# Patient Record
Sex: Female | Born: 1935 | Race: White | Hispanic: No | Marital: Married | Smoking: Never smoker
Health system: Southern US, Community
[De-identification: ages and names within clinical notes are randomized; demographics above are authoritative.]

## PROBLEM LIST (undated history)

## (undated) DIAGNOSIS — E119 Type 2 diabetes mellitus without complications: Secondary | ICD-10-CM

## (undated) DIAGNOSIS — I10 Essential (primary) hypertension: Secondary | ICD-10-CM

## (undated) DIAGNOSIS — E079 Disorder of thyroid, unspecified: Secondary | ICD-10-CM

## (undated) HISTORY — PX: EYE SURGERY: SHX253

## (undated) HISTORY — DX: Disorder of thyroid, unspecified: E07.9

## (undated) HISTORY — DX: Essential (primary) hypertension: I10

## (undated) HISTORY — DX: Type 2 diabetes mellitus without complications: E11.9

---

## 2003-04-24 ENCOUNTER — Other Ambulatory Visit: Admission: RE | Admit: 2003-04-24 | Discharge: 2003-04-24 | Payer: Self-pay | Admitting: Dermatology

## 2008-10-05 ENCOUNTER — Encounter: Payer: Self-pay | Admitting: Endocrinology

## 2008-10-06 ENCOUNTER — Encounter: Payer: Self-pay | Admitting: Endocrinology

## 2008-10-10 ENCOUNTER — Ambulatory Visit: Payer: Self-pay | Admitting: Endocrinology

## 2008-10-10 DIAGNOSIS — I10 Essential (primary) hypertension: Secondary | ICD-10-CM | POA: Insufficient documentation

## 2008-10-10 DIAGNOSIS — F3289 Other specified depressive episodes: Secondary | ICD-10-CM | POA: Insufficient documentation

## 2008-10-10 DIAGNOSIS — E119 Type 2 diabetes mellitus without complications: Secondary | ICD-10-CM

## 2008-10-10 DIAGNOSIS — F329 Major depressive disorder, single episode, unspecified: Secondary | ICD-10-CM

## 2008-10-10 DIAGNOSIS — E0590 Thyrotoxicosis, unspecified without thyrotoxic crisis or storm: Secondary | ICD-10-CM | POA: Insufficient documentation

## 2008-10-12 ENCOUNTER — Ambulatory Visit (HOSPITAL_COMMUNITY): Admission: RE | Admit: 2008-10-12 | Discharge: 2008-10-12 | Payer: Self-pay | Admitting: Family Medicine

## 2008-10-24 ENCOUNTER — Encounter: Payer: Self-pay | Admitting: Endocrinology

## 2008-10-27 ENCOUNTER — Telehealth (INDEPENDENT_AMBULATORY_CARE_PROVIDER_SITE_OTHER): Payer: Self-pay | Admitting: *Deleted

## 2008-10-30 ENCOUNTER — Encounter: Payer: Self-pay | Admitting: Internal Medicine

## 2008-11-07 ENCOUNTER — Telehealth (INDEPENDENT_AMBULATORY_CARE_PROVIDER_SITE_OTHER): Payer: Self-pay | Admitting: *Deleted

## 2008-11-24 ENCOUNTER — Encounter: Payer: Self-pay | Admitting: Endocrinology

## 2008-12-01 ENCOUNTER — Ambulatory Visit: Payer: Self-pay | Admitting: Endocrinology

## 2008-12-22 ENCOUNTER — Ambulatory Visit: Payer: Self-pay | Admitting: Endocrinology

## 2008-12-22 LAB — CONVERTED CEMR LAB
Basophils Relative: 1 % (ref 0.0–3.0)
Eosinophils Relative: 0.6 % (ref 0.0–5.0)
Free T4: 3.5 ng/dL — ABNORMAL HIGH (ref 0.6–1.6)
HCT: 27.4 % — ABNORMAL LOW (ref 36.0–46.0)
Monocytes Relative: 4.5 % (ref 3.0–12.0)
Neutro Abs: 4.9 10*3/uL (ref 1.4–7.7)
RDW: 12.8 % (ref 11.5–14.6)
WBC: 8.3 10*3/uL (ref 4.5–10.5)

## 2009-01-02 ENCOUNTER — Encounter: Payer: Self-pay | Admitting: Endocrinology

## 2009-01-26 ENCOUNTER — Ambulatory Visit: Payer: Self-pay | Admitting: Endocrinology

## 2009-01-26 LAB — CONVERTED CEMR LAB
Free T4: 1.2 ng/dL (ref 0.6–1.6)
TSH: 0.03 microintl units/mL — ABNORMAL LOW (ref 0.35–5.50)

## 2009-01-29 ENCOUNTER — Telehealth (INDEPENDENT_AMBULATORY_CARE_PROVIDER_SITE_OTHER): Payer: Self-pay | Admitting: *Deleted

## 2009-02-27 ENCOUNTER — Encounter: Payer: Self-pay | Admitting: Endocrinology

## 2009-03-05 ENCOUNTER — Encounter: Payer: Self-pay | Admitting: Endocrinology

## 2009-04-05 ENCOUNTER — Ambulatory Visit: Payer: Self-pay | Admitting: Endocrinology

## 2009-05-18 ENCOUNTER — Ambulatory Visit: Payer: Self-pay | Admitting: Endocrinology

## 2009-05-18 DIAGNOSIS — E042 Nontoxic multinodular goiter: Secondary | ICD-10-CM

## 2009-05-18 LAB — CONVERTED CEMR LAB: TSH: 0.78 microintl units/mL (ref 0.35–5.50)

## 2009-05-30 ENCOUNTER — Encounter: Payer: Self-pay | Admitting: Endocrinology

## 2009-06-06 ENCOUNTER — Encounter: Payer: Self-pay | Admitting: Endocrinology

## 2009-06-25 ENCOUNTER — Encounter: Payer: Self-pay | Admitting: Endocrinology

## 2009-06-28 ENCOUNTER — Encounter: Payer: Self-pay | Admitting: Endocrinology

## 2009-06-29 ENCOUNTER — Telehealth: Payer: Self-pay | Admitting: Endocrinology

## 2009-07-03 ENCOUNTER — Encounter: Payer: Self-pay | Admitting: Endocrinology

## 2009-07-09 ENCOUNTER — Telehealth: Payer: Self-pay | Admitting: Endocrinology

## 2009-07-17 ENCOUNTER — Telehealth: Payer: Self-pay | Admitting: Endocrinology

## 2009-07-23 ENCOUNTER — Telehealth: Payer: Self-pay | Admitting: Endocrinology

## 2009-08-17 ENCOUNTER — Ambulatory Visit: Payer: Self-pay | Admitting: Endocrinology

## 2009-08-17 LAB — CONVERTED CEMR LAB: TSH: 8.79 microintl units/mL — ABNORMAL HIGH (ref 0.35–5.50)

## 2009-09-25 ENCOUNTER — Encounter: Payer: Self-pay | Admitting: Endocrinology

## 2010-07-24 IMAGING — CT NM PET TUM IMG INITIAL (PI) SKULL BASE T - THIGH
6 series · 25 of 25 positions shown · IV contrast (350 OM)
Comparison: none

CLINICAL DATA: Initial treatment strategy for pulmonary nodule.

NUCLEAR MEDICINE PET CT INITIAL (PI) SKULL BASE TO THIGH
TECHNIQUE: 18.4 mCi F-18 FDG was injected intravenously via the
right antecubital fossa.  Full-ring PET imaging was performed from
the skull base through the mid-thighs 56  minutes after injection.
CT data was obtained and used for attenuation correction and
anatomic localization only.  (This was not acquired as a diagnostic
CT examination.)
Fasting Blood Glucose:   157

[Series 1: pet ac · axial · 3.3mm · 4.69mm/px · z∈[-870,+0]mm · 5 of 267 slices shown]
[im 1/267]
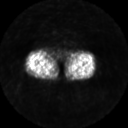
[im 67/267]
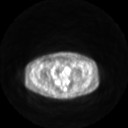
[im 134/267]
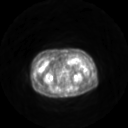
[im 200/267]
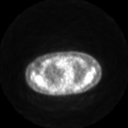
[im 267/267]
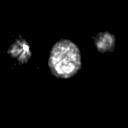

[Series 2: ct images · axial · 3.8mm · 0.98mm/px · z∈[-870,+0]mm · 6 of 267 slices shown]
[im 1/267]
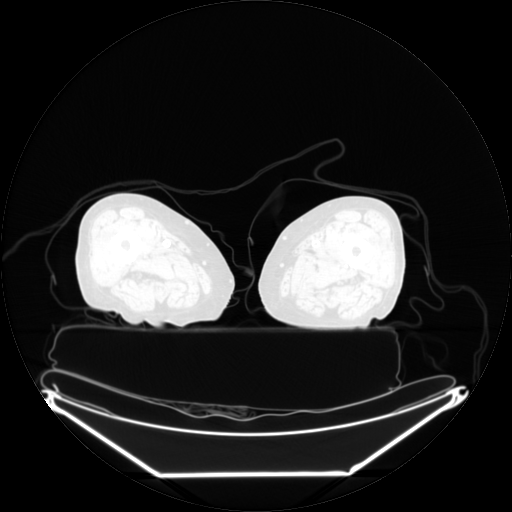
[im 54/267]
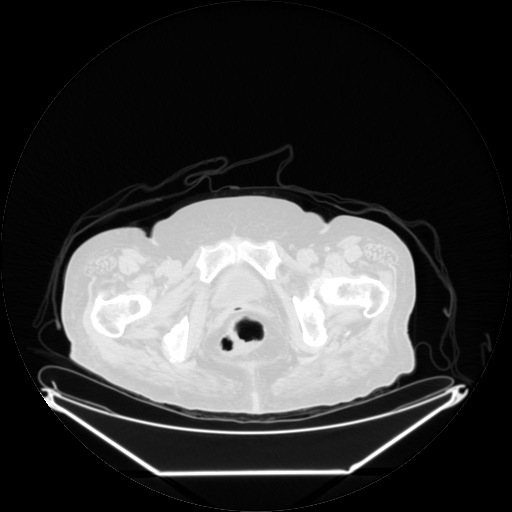
[im 107/267]
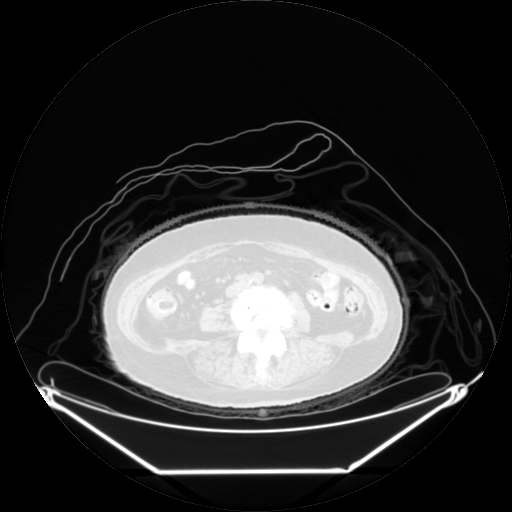
[im 160/267]
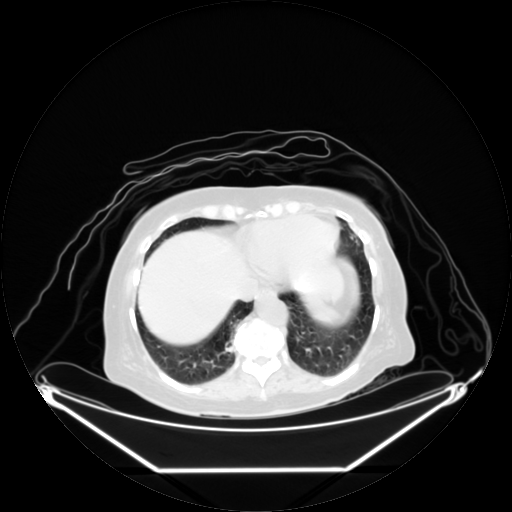
[im 213/267]
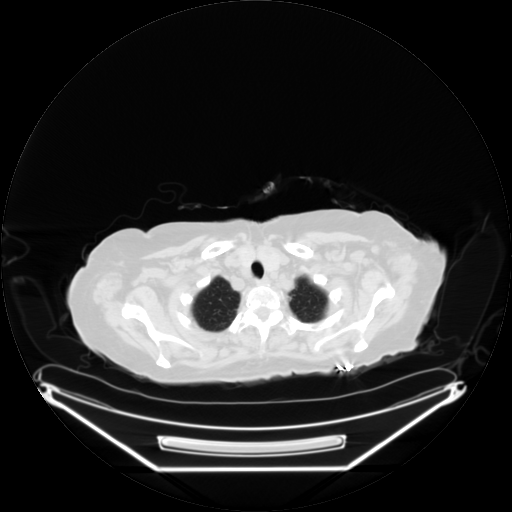
[im 267/267]
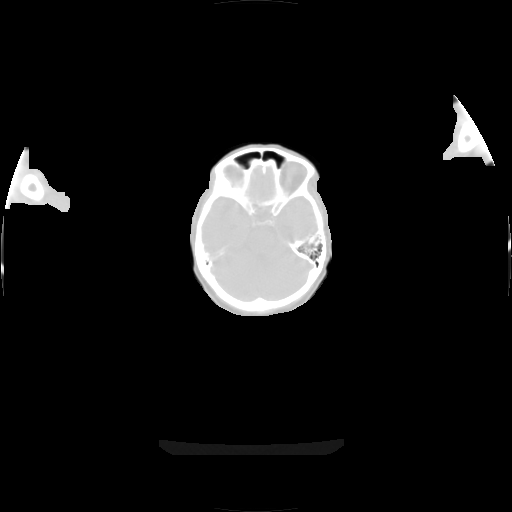

[Series 2: pet nac · axial · 3.3mm · 4.69mm/px · z∈[-870,+0]mm · 6 of 267 slices shown]
[im 1/267]
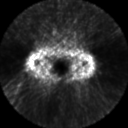
[im 54/267]
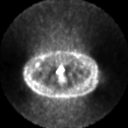
[im 107/267]
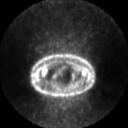
[im 160/267]
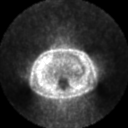
[im 213/267]
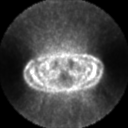
[im 267/267]
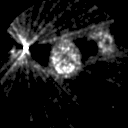

[Series 123: mip · coronal · 3.3mm · 4.69mm/px · 1 of 30 slices shown]
[im 1/30]
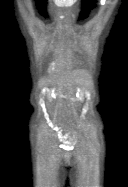

[Series 151: reformatted · axial · 3.3mm · 3.91mm/px · z∈[-870,+0]mm · 6 of 265 slices shown (1 of 2)]
[im 1/265]
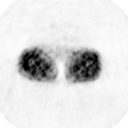
[im 53/265]
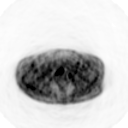
[im 106/265]
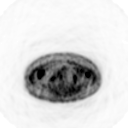
[im 159/265]
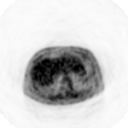
[im 212/265]
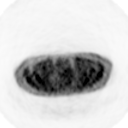
[im 265/265]
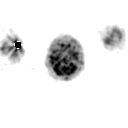

[Series 153: reformatted · coronal · 4.7mm · 6.98mm/px · 1 of 60 slices shown (2 of 2)]
[im 1/60]
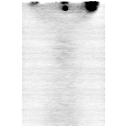

[25 of 25 positions shown; findings below may reference images not displayed]

FINDINGS: There are no hypermetabolic lymph nodes within the soft
tissues of the neck.

There are no hypermetabolic supraclavicular or axillary lymph
nodes.

No hypermetabolic mediastinal or hilar lymph nodes are identified.

There is no pericardial or pleural effusion.

The small subpleural density within the medial aspect of the right
lower lobe is again identified.  This is not significantly changed
from previous exam.  There is no malignant range FDG uptake
associated with this structure.

There are no additional suspicious pulmonary nodules or masses
identified.

No abnormal FDG uptake is identified. The liver is negative.

Spleen is negative.

There is no abnormal uptake within the adrenal glands.

No abnormal uptake within the pancreas.

No hypermetabolic retroperitoneal or small bowel mesenteric lymph
nodes.

There are no hypermetabolic pelvic or inguinal lymph nodes.

There is no hypermetabolic pelvic mass.

Review of the visualized osseous structures shows no specific
evidence for hypermetabolic bone metastases.
IMPRESSION: 1.  There is no abnormal FDG uptake associated with the subpleural
density in the right lower lobe.  This likely represents a benign
finding.  This has been stable for 6 months.  I would recommend a
follow-up noncontrast CT of the chest in six months from today to
ensure continued stability.

## 2010-08-11 ENCOUNTER — Encounter: Payer: Self-pay | Admitting: Family Medicine

## 2010-08-11 ENCOUNTER — Encounter: Payer: Self-pay | Admitting: Endocrinology

## 2010-08-20 NOTE — Letter (Signed)
Summary: [facility]  [facility]   Imported By: Sherian Rein 06/27/2009 08:11:38  _____________________________________________________________________  External Attachment:    Type:   Image     Comment:   External Document

## 2010-08-20 NOTE — Progress Notes (Signed)
Summary: I-131 therapy  Phone Note Call from Patient   Caller: Daughter [place] Citizen [phone] Summary of Call: pt's daughter called requesting a call from MD. pt's daughter states that pt is in a skilled nursing facility and she does not think pt will be allowed to go back to facility after I-131. Pt's daughter would like to discuss options with MD. please advise. Initial call taken by: Margaret Pyle, CMA,  July 23, 2009 9:00 AM  Follow-up for Phone Call        i called back 07/23/09.  as it is uncertain how long pt will be in rehab facility, i would advise tapazole 10 mg once daily.  however, for safety reasons, i cannot order this because i do not have access to her med list and she can't travel here for f/u.  i will fax this message to dr sasser, but i don't know if he can prescribe this either. Follow-up by: Minus Breeding MD,  July 23, 2009 12:36 PM

## 2010-08-20 NOTE — Assessment & Plan Note (Signed)
Summary: 3 MOROV /NWS #   Vital Signs:  Patient profile:   77 year old female Height:      64 inches (162.56 cm) Weight:      157.12 pounds (71.42 kg) O2 Sat:      97 % on Room air Temp:     97.7 degrees F (36.50 degrees C) oral Pulse rate:   85 / minute BP sitting:   152 / 98  (left arm) Cuff size:   regular  Vitals Entered By: Orlan Leavens (August 17, 2009 9:56 AM)  O2 Flow:  Room air CC: 3 month follow-up Is Patient Diabetic? Yes Did you bring your meter with you today? No Pain Assessment Patient in pain? no        Referring Provider:  Dr. Neita Carp Primary Provider:  Dr. Fara Chute  CC:  3 month follow-up.  History of Present Illness: pt is now out of rehab facility.  she is on tapazole, and feels better recently.    Current Medications (verified): 1)  Sertraline Hcl 50 Mg Tabs (Sertraline Hcl) .... Take 1 By Mouth Qd 2)  Metformin Hcl 500 Mg Xr24h-Tab (Metformin Hcl) .... Take 2 By Mouth Qd 3)  Lomotil 2.5-0.025 Mg Tabs (Diphenoxylate-Atropine) .Marland Kitchen.. 1 Tab After A Loose Stool As Needed 4)  Omeprazole 20 Mg Cpdr (Omeprazole) .Marland Kitchen.. 1 Daily 5)  Triamcinolone Acetonide 0.1 % Crea (Triamcinolone Acetonide) .... Three Times A Day As Needed Itching 6)  Toprol Xl 25 Mg Xr24h-Tab (Metoprolol Succinate) .Marland Kitchen.. 1 Qd 7)  Tapazole 10 Mg Tabs (Methimazole) .... Take 1 By Mouth Once Daily 8)  Ativan 0.5 Mg Tabs (Lorazepam) .... Take 1 Q 8 Hours As Needed 9)  Oscal 500/200 D-3 500-200 Mg-Unit Tabs (Calcium-Vitamin D) .... Take 2 By Mouth Qd 10)  Multivitamins  Tabs (Multiple Vitamin) .... Once Daily  Allergies (verified): 1)  ! * All Anti-Inflamm  Past History:  Past Medical History: Last updated: 12/22/2008 DEPRESSION (ICD-311) HYPERTENSION (ICD-401.9) DM (ICD-250.00) HYPERTHYROIDISM (ICD-242.90)  Review of Systems  The patient denies fever.    Physical Exam  General:  elderly, frail, no distress. in wheelchair Neck:  the is a small multinodular goiter, right > left    Additional Exam:  FastTSH              [H]  8.79 uIU/mL                 0.35-5.50 Free T4              [L]  0.3 ng/dL                   5.4-0.9    Impression & Recommendations:  Problem # 1:  HYPERTHYROIDISM (ICD-242.90) much better  Medications Added to Medication List This Visit: 1)  Metformin Hcl 500 Mg Xr24h-tab (Metformin hcl) .... Take 2 by mouth qd 2)  Tapazole 10 Mg Tabs (Methimazole) .... Take 1 by mouth once daily 3)  Ativan 0.5 Mg Tabs (Lorazepam) .... Take 1 q 8 hours as needed 4)  Oscal 500/200 D-3 500-200 Mg-unit Tabs (Calcium-vitamin d) .... Take 2 by mouth qd 5)  Multivitamins Tabs (Multiple vitamin) .... Once daily 6)  Methimazole 5 Mg Tabs (Methimazole) .Marland Kitchen.. 1 tab three times weekly (m,w,f)  Other Orders: TLB-TSH (Thyroid Stimulating Hormone) (84443-TSH) TLB-T4 (Thyrox), Free [phone]) Est. Patient Level III (95621)  Patient Instructions: 1)  tests are being ordered for you today.  a few days after the test(s), please call [phone]  to hear your test results. 2)  pending the test results, please continue the same medications for now. 3)  (update: i left message on phone-tree:  reduce methimazole to 5 mg three times weekly (m,w,f).  please see dr Neita Carp in 6 weeks, and me in 6 months (as it is difficult for pt to travel here) Prescriptions: METHIMAZOLE 5 MG TABS (METHIMAZOLE) 1 tab three times weekly (m,w,f)  #12 x 5   Entered and Authorized by:   Minus Breeding MD   Signed by:   Minus Breeding MD on 08/17/2009   Method used:   Electronically to        [facility] (retail)       [address]       [place], [place]  [address]       Ph: [phone]       Fax: [phone]   RxID:   [id number]    Immunization History:  Influenza Immunization History:    Influenza:  historical (04/20/2009)

## 2010-08-20 NOTE — Miscellaneous (Signed)
Summary: [facility]  Clinical Lists Changes  Observations: Added new observation of HGBA1C: 7.4 % (05/30/2009 15:31)         -  Date:  05/30/2009    HbA1c: 7.4 DONE AT [facility]/ [facility]

## 2013-08-15 ENCOUNTER — Encounter (INDEPENDENT_AMBULATORY_CARE_PROVIDER_SITE_OTHER): Payer: Self-pay | Admitting: Ophthalmology

## 2014-11-29 DIAGNOSIS — F330 Major depressive disorder, recurrent, mild: Secondary | ICD-10-CM | POA: Diagnosis not present

## 2014-11-29 DIAGNOSIS — E782 Mixed hyperlipidemia: Secondary | ICD-10-CM | POA: Diagnosis not present

## 2014-11-29 DIAGNOSIS — E038 Other specified hypothyroidism: Secondary | ICD-10-CM | POA: Diagnosis not present

## 2014-11-29 DIAGNOSIS — Z1389 Encounter for screening for other disorder: Secondary | ICD-10-CM | POA: Diagnosis not present

## 2014-11-29 DIAGNOSIS — I10 Essential (primary) hypertension: Secondary | ICD-10-CM | POA: Diagnosis not present

## 2014-11-29 DIAGNOSIS — E1165 Type 2 diabetes mellitus with hyperglycemia: Secondary | ICD-10-CM | POA: Diagnosis not present

## 2014-11-29 DIAGNOSIS — N183 Chronic kidney disease, stage 3 (moderate): Secondary | ICD-10-CM | POA: Diagnosis not present

## 2015-01-08 DIAGNOSIS — I10 Essential (primary) hypertension: Secondary | ICD-10-CM | POA: Diagnosis not present

## 2015-01-08 DIAGNOSIS — E782 Mixed hyperlipidemia: Secondary | ICD-10-CM | POA: Diagnosis not present

## 2015-01-08 DIAGNOSIS — N183 Chronic kidney disease, stage 3 (moderate): Secondary | ICD-10-CM | POA: Diagnosis not present

## 2015-01-08 DIAGNOSIS — E1165 Type 2 diabetes mellitus with hyperglycemia: Secondary | ICD-10-CM | POA: Diagnosis not present

## 2015-01-17 DIAGNOSIS — F330 Major depressive disorder, recurrent, mild: Secondary | ICD-10-CM | POA: Diagnosis not present

## 2015-01-17 DIAGNOSIS — E1165 Type 2 diabetes mellitus with hyperglycemia: Secondary | ICD-10-CM | POA: Diagnosis not present

## 2015-01-17 DIAGNOSIS — E782 Mixed hyperlipidemia: Secondary | ICD-10-CM | POA: Diagnosis not present

## 2015-01-17 DIAGNOSIS — S161XXA Strain of muscle, fascia and tendon at neck level, initial encounter: Secondary | ICD-10-CM | POA: Diagnosis not present

## 2015-01-17 DIAGNOSIS — E038 Other specified hypothyroidism: Secondary | ICD-10-CM | POA: Diagnosis not present

## 2015-01-17 DIAGNOSIS — N183 Chronic kidney disease, stage 3 (moderate): Secondary | ICD-10-CM | POA: Diagnosis not present

## 2015-01-17 DIAGNOSIS — E1122 Type 2 diabetes mellitus with diabetic chronic kidney disease: Secondary | ICD-10-CM | POA: Diagnosis not present

## 2015-01-17 DIAGNOSIS — I10 Essential (primary) hypertension: Secondary | ICD-10-CM | POA: Diagnosis not present

## 2015-03-12 DIAGNOSIS — W1800XA Striking against unspecified object with subsequent fall, initial encounter: Secondary | ICD-10-CM | POA: Diagnosis not present

## 2015-03-12 DIAGNOSIS — S199XXA Unspecified injury of neck, initial encounter: Secondary | ICD-10-CM | POA: Diagnosis not present

## 2015-03-12 DIAGNOSIS — S0990XA Unspecified injury of head, initial encounter: Secondary | ICD-10-CM | POA: Diagnosis not present

## 2015-03-12 DIAGNOSIS — S0190XA Unspecified open wound of unspecified part of head, initial encounter: Secondary | ICD-10-CM | POA: Diagnosis not present

## 2015-03-12 DIAGNOSIS — F419 Anxiety disorder, unspecified: Secondary | ICD-10-CM | POA: Diagnosis not present

## 2015-03-12 DIAGNOSIS — S0103XA Puncture wound without foreign body of scalp, initial encounter: Secondary | ICD-10-CM | POA: Diagnosis not present

## 2015-03-12 DIAGNOSIS — S0191XA Laceration without foreign body of unspecified part of head, initial encounter: Secondary | ICD-10-CM | POA: Diagnosis not present

## 2015-03-12 DIAGNOSIS — E039 Hypothyroidism, unspecified: Secondary | ICD-10-CM | POA: Diagnosis not present

## 2015-03-12 DIAGNOSIS — I10 Essential (primary) hypertension: Secondary | ICD-10-CM | POA: Diagnosis not present

## 2015-03-12 DIAGNOSIS — S299XXA Unspecified injury of thorax, initial encounter: Secondary | ICD-10-CM | POA: Diagnosis not present

## 2015-03-12 DIAGNOSIS — Z79899 Other long term (current) drug therapy: Secondary | ICD-10-CM | POA: Diagnosis not present

## 2015-03-12 DIAGNOSIS — S2191XA Laceration without foreign body of unspecified part of thorax, initial encounter: Secondary | ICD-10-CM | POA: Diagnosis not present

## 2015-03-20 DIAGNOSIS — R296 Repeated falls: Secondary | ICD-10-CM | POA: Diagnosis not present

## 2015-03-20 DIAGNOSIS — R279 Unspecified lack of coordination: Secondary | ICD-10-CM | POA: Diagnosis not present

## 2015-03-20 DIAGNOSIS — L97521 Non-pressure chronic ulcer of other part of left foot limited to breakdown of skin: Secondary | ICD-10-CM | POA: Diagnosis not present

## 2015-03-20 DIAGNOSIS — I129 Hypertensive chronic kidney disease with stage 1 through stage 4 chronic kidney disease, or unspecified chronic kidney disease: Secondary | ICD-10-CM | POA: Diagnosis not present

## 2015-03-20 DIAGNOSIS — F330 Major depressive disorder, recurrent, mild: Secondary | ICD-10-CM | POA: Diagnosis not present

## 2015-03-20 DIAGNOSIS — N183 Chronic kidney disease, stage 3 (moderate): Secondary | ICD-10-CM | POA: Diagnosis not present

## 2015-03-20 DIAGNOSIS — E11621 Type 2 diabetes mellitus with foot ulcer: Secondary | ICD-10-CM | POA: Diagnosis not present

## 2015-03-20 DIAGNOSIS — S0180XD Unspecified open wound of other part of head, subsequent encounter: Secondary | ICD-10-CM | POA: Diagnosis not present

## 2015-03-20 DIAGNOSIS — E1122 Type 2 diabetes mellitus with diabetic chronic kidney disease: Secondary | ICD-10-CM | POA: Diagnosis not present

## 2015-03-21 DIAGNOSIS — E11621 Type 2 diabetes mellitus with foot ulcer: Secondary | ICD-10-CM | POA: Diagnosis not present

## 2015-03-21 DIAGNOSIS — S0180XD Unspecified open wound of other part of head, subsequent encounter: Secondary | ICD-10-CM | POA: Diagnosis not present

## 2015-03-21 DIAGNOSIS — R279 Unspecified lack of coordination: Secondary | ICD-10-CM | POA: Diagnosis not present

## 2015-03-21 DIAGNOSIS — I129 Hypertensive chronic kidney disease with stage 1 through stage 4 chronic kidney disease, or unspecified chronic kidney disease: Secondary | ICD-10-CM | POA: Diagnosis not present

## 2015-03-21 DIAGNOSIS — F330 Major depressive disorder, recurrent, mild: Secondary | ICD-10-CM | POA: Diagnosis not present

## 2015-03-21 DIAGNOSIS — R296 Repeated falls: Secondary | ICD-10-CM | POA: Diagnosis not present

## 2015-03-21 DIAGNOSIS — L97521 Non-pressure chronic ulcer of other part of left foot limited to breakdown of skin: Secondary | ICD-10-CM | POA: Diagnosis not present

## 2015-03-21 DIAGNOSIS — E1122 Type 2 diabetes mellitus with diabetic chronic kidney disease: Secondary | ICD-10-CM | POA: Diagnosis not present

## 2015-03-21 DIAGNOSIS — N183 Chronic kidney disease, stage 3 (moderate): Secondary | ICD-10-CM | POA: Diagnosis not present

## 2015-03-22 DIAGNOSIS — R296 Repeated falls: Secondary | ICD-10-CM | POA: Diagnosis not present

## 2015-03-22 DIAGNOSIS — I129 Hypertensive chronic kidney disease with stage 1 through stage 4 chronic kidney disease, or unspecified chronic kidney disease: Secondary | ICD-10-CM | POA: Diagnosis not present

## 2015-03-22 DIAGNOSIS — S0180XD Unspecified open wound of other part of head, subsequent encounter: Secondary | ICD-10-CM | POA: Diagnosis not present

## 2015-03-22 DIAGNOSIS — L97521 Non-pressure chronic ulcer of other part of left foot limited to breakdown of skin: Secondary | ICD-10-CM | POA: Diagnosis not present

## 2015-03-22 DIAGNOSIS — R279 Unspecified lack of coordination: Secondary | ICD-10-CM | POA: Diagnosis not present

## 2015-03-22 DIAGNOSIS — E11621 Type 2 diabetes mellitus with foot ulcer: Secondary | ICD-10-CM | POA: Diagnosis not present

## 2015-03-22 DIAGNOSIS — N183 Chronic kidney disease, stage 3 (moderate): Secondary | ICD-10-CM | POA: Diagnosis not present

## 2015-03-22 DIAGNOSIS — F330 Major depressive disorder, recurrent, mild: Secondary | ICD-10-CM | POA: Diagnosis not present

## 2015-03-22 DIAGNOSIS — E1122 Type 2 diabetes mellitus with diabetic chronic kidney disease: Secondary | ICD-10-CM | POA: Diagnosis not present

## 2015-03-23 DIAGNOSIS — E1122 Type 2 diabetes mellitus with diabetic chronic kidney disease: Secondary | ICD-10-CM | POA: Diagnosis not present

## 2015-03-23 DIAGNOSIS — R296 Repeated falls: Secondary | ICD-10-CM | POA: Diagnosis not present

## 2015-03-23 DIAGNOSIS — F330 Major depressive disorder, recurrent, mild: Secondary | ICD-10-CM | POA: Diagnosis not present

## 2015-03-23 DIAGNOSIS — E11621 Type 2 diabetes mellitus with foot ulcer: Secondary | ICD-10-CM | POA: Diagnosis not present

## 2015-03-23 DIAGNOSIS — S0180XD Unspecified open wound of other part of head, subsequent encounter: Secondary | ICD-10-CM | POA: Diagnosis not present

## 2015-03-23 DIAGNOSIS — N183 Chronic kidney disease, stage 3 (moderate): Secondary | ICD-10-CM | POA: Diagnosis not present

## 2015-03-23 DIAGNOSIS — I129 Hypertensive chronic kidney disease with stage 1 through stage 4 chronic kidney disease, or unspecified chronic kidney disease: Secondary | ICD-10-CM | POA: Diagnosis not present

## 2015-03-23 DIAGNOSIS — R279 Unspecified lack of coordination: Secondary | ICD-10-CM | POA: Diagnosis not present

## 2015-03-23 DIAGNOSIS — L97521 Non-pressure chronic ulcer of other part of left foot limited to breakdown of skin: Secondary | ICD-10-CM | POA: Diagnosis not present

## 2015-03-26 DIAGNOSIS — E11621 Type 2 diabetes mellitus with foot ulcer: Secondary | ICD-10-CM | POA: Diagnosis not present

## 2015-03-26 DIAGNOSIS — N183 Chronic kidney disease, stage 3 (moderate): Secondary | ICD-10-CM | POA: Diagnosis not present

## 2015-03-26 DIAGNOSIS — R279 Unspecified lack of coordination: Secondary | ICD-10-CM | POA: Diagnosis not present

## 2015-03-26 DIAGNOSIS — F330 Major depressive disorder, recurrent, mild: Secondary | ICD-10-CM | POA: Diagnosis not present

## 2015-03-26 DIAGNOSIS — E1122 Type 2 diabetes mellitus with diabetic chronic kidney disease: Secondary | ICD-10-CM | POA: Diagnosis not present

## 2015-03-26 DIAGNOSIS — R296 Repeated falls: Secondary | ICD-10-CM | POA: Diagnosis not present

## 2015-03-26 DIAGNOSIS — I129 Hypertensive chronic kidney disease with stage 1 through stage 4 chronic kidney disease, or unspecified chronic kidney disease: Secondary | ICD-10-CM | POA: Diagnosis not present

## 2015-03-26 DIAGNOSIS — S0180XD Unspecified open wound of other part of head, subsequent encounter: Secondary | ICD-10-CM | POA: Diagnosis not present

## 2015-03-26 DIAGNOSIS — L97521 Non-pressure chronic ulcer of other part of left foot limited to breakdown of skin: Secondary | ICD-10-CM | POA: Diagnosis not present

## 2015-03-28 DIAGNOSIS — E1122 Type 2 diabetes mellitus with diabetic chronic kidney disease: Secondary | ICD-10-CM | POA: Diagnosis not present

## 2015-03-28 DIAGNOSIS — I129 Hypertensive chronic kidney disease with stage 1 through stage 4 chronic kidney disease, or unspecified chronic kidney disease: Secondary | ICD-10-CM | POA: Diagnosis not present

## 2015-03-28 DIAGNOSIS — F330 Major depressive disorder, recurrent, mild: Secondary | ICD-10-CM | POA: Diagnosis not present

## 2015-03-28 DIAGNOSIS — E11621 Type 2 diabetes mellitus with foot ulcer: Secondary | ICD-10-CM | POA: Diagnosis not present

## 2015-03-28 DIAGNOSIS — N183 Chronic kidney disease, stage 3 (moderate): Secondary | ICD-10-CM | POA: Diagnosis not present

## 2015-03-28 DIAGNOSIS — R296 Repeated falls: Secondary | ICD-10-CM | POA: Diagnosis not present

## 2015-03-28 DIAGNOSIS — S0180XD Unspecified open wound of other part of head, subsequent encounter: Secondary | ICD-10-CM | POA: Diagnosis not present

## 2015-03-28 DIAGNOSIS — L97521 Non-pressure chronic ulcer of other part of left foot limited to breakdown of skin: Secondary | ICD-10-CM | POA: Diagnosis not present

## 2015-03-28 DIAGNOSIS — R279 Unspecified lack of coordination: Secondary | ICD-10-CM | POA: Diagnosis not present

## 2015-03-29 DIAGNOSIS — E11621 Type 2 diabetes mellitus with foot ulcer: Secondary | ICD-10-CM | POA: Diagnosis not present

## 2015-03-29 DIAGNOSIS — R296 Repeated falls: Secondary | ICD-10-CM | POA: Diagnosis not present

## 2015-03-29 DIAGNOSIS — N183 Chronic kidney disease, stage 3 (moderate): Secondary | ICD-10-CM | POA: Diagnosis not present

## 2015-03-29 DIAGNOSIS — E1122 Type 2 diabetes mellitus with diabetic chronic kidney disease: Secondary | ICD-10-CM | POA: Diagnosis not present

## 2015-03-29 DIAGNOSIS — L97521 Non-pressure chronic ulcer of other part of left foot limited to breakdown of skin: Secondary | ICD-10-CM | POA: Diagnosis not present

## 2015-03-29 DIAGNOSIS — I129 Hypertensive chronic kidney disease with stage 1 through stage 4 chronic kidney disease, or unspecified chronic kidney disease: Secondary | ICD-10-CM | POA: Diagnosis not present

## 2015-03-29 DIAGNOSIS — S0180XD Unspecified open wound of other part of head, subsequent encounter: Secondary | ICD-10-CM | POA: Diagnosis not present

## 2015-03-29 DIAGNOSIS — F330 Major depressive disorder, recurrent, mild: Secondary | ICD-10-CM | POA: Diagnosis not present

## 2015-03-29 DIAGNOSIS — R279 Unspecified lack of coordination: Secondary | ICD-10-CM | POA: Diagnosis not present

## 2015-03-30 DIAGNOSIS — N183 Chronic kidney disease, stage 3 (moderate): Secondary | ICD-10-CM | POA: Diagnosis not present

## 2015-03-30 DIAGNOSIS — S0180XD Unspecified open wound of other part of head, subsequent encounter: Secondary | ICD-10-CM | POA: Diagnosis not present

## 2015-03-30 DIAGNOSIS — F330 Major depressive disorder, recurrent, mild: Secondary | ICD-10-CM | POA: Diagnosis not present

## 2015-03-30 DIAGNOSIS — E11621 Type 2 diabetes mellitus with foot ulcer: Secondary | ICD-10-CM | POA: Diagnosis not present

## 2015-03-30 DIAGNOSIS — E1122 Type 2 diabetes mellitus with diabetic chronic kidney disease: Secondary | ICD-10-CM | POA: Diagnosis not present

## 2015-03-30 DIAGNOSIS — L97521 Non-pressure chronic ulcer of other part of left foot limited to breakdown of skin: Secondary | ICD-10-CM | POA: Diagnosis not present

## 2015-03-30 DIAGNOSIS — R296 Repeated falls: Secondary | ICD-10-CM | POA: Diagnosis not present

## 2015-03-30 DIAGNOSIS — R279 Unspecified lack of coordination: Secondary | ICD-10-CM | POA: Diagnosis not present

## 2015-03-30 DIAGNOSIS — I129 Hypertensive chronic kidney disease with stage 1 through stage 4 chronic kidney disease, or unspecified chronic kidney disease: Secondary | ICD-10-CM | POA: Diagnosis not present

## 2015-04-02 DIAGNOSIS — N183 Chronic kidney disease, stage 3 (moderate): Secondary | ICD-10-CM | POA: Diagnosis not present

## 2015-04-02 DIAGNOSIS — E11621 Type 2 diabetes mellitus with foot ulcer: Secondary | ICD-10-CM | POA: Diagnosis not present

## 2015-04-02 DIAGNOSIS — R296 Repeated falls: Secondary | ICD-10-CM | POA: Diagnosis not present

## 2015-04-02 DIAGNOSIS — E1122 Type 2 diabetes mellitus with diabetic chronic kidney disease: Secondary | ICD-10-CM | POA: Diagnosis not present

## 2015-04-02 DIAGNOSIS — S0180XD Unspecified open wound of other part of head, subsequent encounter: Secondary | ICD-10-CM | POA: Diagnosis not present

## 2015-04-02 DIAGNOSIS — F330 Major depressive disorder, recurrent, mild: Secondary | ICD-10-CM | POA: Diagnosis not present

## 2015-04-02 DIAGNOSIS — I129 Hypertensive chronic kidney disease with stage 1 through stage 4 chronic kidney disease, or unspecified chronic kidney disease: Secondary | ICD-10-CM | POA: Diagnosis not present

## 2015-04-02 DIAGNOSIS — L97521 Non-pressure chronic ulcer of other part of left foot limited to breakdown of skin: Secondary | ICD-10-CM | POA: Diagnosis not present

## 2015-04-02 DIAGNOSIS — R279 Unspecified lack of coordination: Secondary | ICD-10-CM | POA: Diagnosis not present

## 2015-04-03 DIAGNOSIS — F330 Major depressive disorder, recurrent, mild: Secondary | ICD-10-CM | POA: Diagnosis not present

## 2015-04-03 DIAGNOSIS — E1122 Type 2 diabetes mellitus with diabetic chronic kidney disease: Secondary | ICD-10-CM | POA: Diagnosis not present

## 2015-04-03 DIAGNOSIS — L97521 Non-pressure chronic ulcer of other part of left foot limited to breakdown of skin: Secondary | ICD-10-CM | POA: Diagnosis not present

## 2015-04-03 DIAGNOSIS — N183 Chronic kidney disease, stage 3 (moderate): Secondary | ICD-10-CM | POA: Diagnosis not present

## 2015-04-03 DIAGNOSIS — S0180XD Unspecified open wound of other part of head, subsequent encounter: Secondary | ICD-10-CM | POA: Diagnosis not present

## 2015-04-03 DIAGNOSIS — I129 Hypertensive chronic kidney disease with stage 1 through stage 4 chronic kidney disease, or unspecified chronic kidney disease: Secondary | ICD-10-CM | POA: Diagnosis not present

## 2015-04-03 DIAGNOSIS — R296 Repeated falls: Secondary | ICD-10-CM | POA: Diagnosis not present

## 2015-04-03 DIAGNOSIS — E11621 Type 2 diabetes mellitus with foot ulcer: Secondary | ICD-10-CM | POA: Diagnosis not present

## 2015-04-03 DIAGNOSIS — R279 Unspecified lack of coordination: Secondary | ICD-10-CM | POA: Diagnosis not present

## 2015-04-04 DIAGNOSIS — R296 Repeated falls: Secondary | ICD-10-CM | POA: Diagnosis not present

## 2015-04-04 DIAGNOSIS — N183 Chronic kidney disease, stage 3 (moderate): Secondary | ICD-10-CM | POA: Diagnosis not present

## 2015-04-04 DIAGNOSIS — E1122 Type 2 diabetes mellitus with diabetic chronic kidney disease: Secondary | ICD-10-CM | POA: Diagnosis not present

## 2015-04-04 DIAGNOSIS — E11621 Type 2 diabetes mellitus with foot ulcer: Secondary | ICD-10-CM | POA: Diagnosis not present

## 2015-04-04 DIAGNOSIS — L97521 Non-pressure chronic ulcer of other part of left foot limited to breakdown of skin: Secondary | ICD-10-CM | POA: Diagnosis not present

## 2015-04-04 DIAGNOSIS — F330 Major depressive disorder, recurrent, mild: Secondary | ICD-10-CM | POA: Diagnosis not present

## 2015-04-04 DIAGNOSIS — R279 Unspecified lack of coordination: Secondary | ICD-10-CM | POA: Diagnosis not present

## 2015-04-04 DIAGNOSIS — S0180XD Unspecified open wound of other part of head, subsequent encounter: Secondary | ICD-10-CM | POA: Diagnosis not present

## 2015-04-04 DIAGNOSIS — I129 Hypertensive chronic kidney disease with stage 1 through stage 4 chronic kidney disease, or unspecified chronic kidney disease: Secondary | ICD-10-CM | POA: Diagnosis not present

## 2015-04-05 DIAGNOSIS — E11621 Type 2 diabetes mellitus with foot ulcer: Secondary | ICD-10-CM | POA: Diagnosis not present

## 2015-04-05 DIAGNOSIS — L97521 Non-pressure chronic ulcer of other part of left foot limited to breakdown of skin: Secondary | ICD-10-CM | POA: Diagnosis not present

## 2015-04-05 DIAGNOSIS — N183 Chronic kidney disease, stage 3 (moderate): Secondary | ICD-10-CM | POA: Diagnosis not present

## 2015-04-05 DIAGNOSIS — S0180XD Unspecified open wound of other part of head, subsequent encounter: Secondary | ICD-10-CM | POA: Diagnosis not present

## 2015-04-05 DIAGNOSIS — I129 Hypertensive chronic kidney disease with stage 1 through stage 4 chronic kidney disease, or unspecified chronic kidney disease: Secondary | ICD-10-CM | POA: Diagnosis not present

## 2015-04-05 DIAGNOSIS — F330 Major depressive disorder, recurrent, mild: Secondary | ICD-10-CM | POA: Diagnosis not present

## 2015-04-05 DIAGNOSIS — E1122 Type 2 diabetes mellitus with diabetic chronic kidney disease: Secondary | ICD-10-CM | POA: Diagnosis not present

## 2015-04-05 DIAGNOSIS — R279 Unspecified lack of coordination: Secondary | ICD-10-CM | POA: Diagnosis not present

## 2015-04-05 DIAGNOSIS — R296 Repeated falls: Secondary | ICD-10-CM | POA: Diagnosis not present

## 2015-04-06 DIAGNOSIS — E1122 Type 2 diabetes mellitus with diabetic chronic kidney disease: Secondary | ICD-10-CM | POA: Diagnosis not present

## 2015-04-06 DIAGNOSIS — E11621 Type 2 diabetes mellitus with foot ulcer: Secondary | ICD-10-CM | POA: Diagnosis not present

## 2015-04-06 DIAGNOSIS — R279 Unspecified lack of coordination: Secondary | ICD-10-CM | POA: Diagnosis not present

## 2015-04-06 DIAGNOSIS — N183 Chronic kidney disease, stage 3 (moderate): Secondary | ICD-10-CM | POA: Diagnosis not present

## 2015-04-06 DIAGNOSIS — I129 Hypertensive chronic kidney disease with stage 1 through stage 4 chronic kidney disease, or unspecified chronic kidney disease: Secondary | ICD-10-CM | POA: Diagnosis not present

## 2015-04-06 DIAGNOSIS — S0180XD Unspecified open wound of other part of head, subsequent encounter: Secondary | ICD-10-CM | POA: Diagnosis not present

## 2015-04-06 DIAGNOSIS — F330 Major depressive disorder, recurrent, mild: Secondary | ICD-10-CM | POA: Diagnosis not present

## 2015-04-06 DIAGNOSIS — R296 Repeated falls: Secondary | ICD-10-CM | POA: Diagnosis not present

## 2015-04-06 DIAGNOSIS — L97521 Non-pressure chronic ulcer of other part of left foot limited to breakdown of skin: Secondary | ICD-10-CM | POA: Diagnosis not present

## 2015-04-09 DIAGNOSIS — E1122 Type 2 diabetes mellitus with diabetic chronic kidney disease: Secondary | ICD-10-CM | POA: Diagnosis not present

## 2015-04-09 DIAGNOSIS — I129 Hypertensive chronic kidney disease with stage 1 through stage 4 chronic kidney disease, or unspecified chronic kidney disease: Secondary | ICD-10-CM | POA: Diagnosis not present

## 2015-04-09 DIAGNOSIS — L97521 Non-pressure chronic ulcer of other part of left foot limited to breakdown of skin: Secondary | ICD-10-CM | POA: Diagnosis not present

## 2015-04-09 DIAGNOSIS — R296 Repeated falls: Secondary | ICD-10-CM | POA: Diagnosis not present

## 2015-04-09 DIAGNOSIS — S0180XD Unspecified open wound of other part of head, subsequent encounter: Secondary | ICD-10-CM | POA: Diagnosis not present

## 2015-04-09 DIAGNOSIS — R279 Unspecified lack of coordination: Secondary | ICD-10-CM | POA: Diagnosis not present

## 2015-04-09 DIAGNOSIS — N183 Chronic kidney disease, stage 3 (moderate): Secondary | ICD-10-CM | POA: Diagnosis not present

## 2015-04-09 DIAGNOSIS — F330 Major depressive disorder, recurrent, mild: Secondary | ICD-10-CM | POA: Diagnosis not present

## 2015-04-09 DIAGNOSIS — E11621 Type 2 diabetes mellitus with foot ulcer: Secondary | ICD-10-CM | POA: Diagnosis not present

## 2015-04-10 DIAGNOSIS — I129 Hypertensive chronic kidney disease with stage 1 through stage 4 chronic kidney disease, or unspecified chronic kidney disease: Secondary | ICD-10-CM | POA: Diagnosis not present

## 2015-04-10 DIAGNOSIS — E11621 Type 2 diabetes mellitus with foot ulcer: Secondary | ICD-10-CM | POA: Diagnosis not present

## 2015-04-10 DIAGNOSIS — R296 Repeated falls: Secondary | ICD-10-CM | POA: Diagnosis not present

## 2015-04-10 DIAGNOSIS — S0180XD Unspecified open wound of other part of head, subsequent encounter: Secondary | ICD-10-CM | POA: Diagnosis not present

## 2015-04-10 DIAGNOSIS — N183 Chronic kidney disease, stage 3 (moderate): Secondary | ICD-10-CM | POA: Diagnosis not present

## 2015-04-10 DIAGNOSIS — E1122 Type 2 diabetes mellitus with diabetic chronic kidney disease: Secondary | ICD-10-CM | POA: Diagnosis not present

## 2015-04-10 DIAGNOSIS — L97521 Non-pressure chronic ulcer of other part of left foot limited to breakdown of skin: Secondary | ICD-10-CM | POA: Diagnosis not present

## 2015-04-10 DIAGNOSIS — R279 Unspecified lack of coordination: Secondary | ICD-10-CM | POA: Diagnosis not present

## 2015-04-10 DIAGNOSIS — F330 Major depressive disorder, recurrent, mild: Secondary | ICD-10-CM | POA: Diagnosis not present

## 2015-04-12 DIAGNOSIS — L97521 Non-pressure chronic ulcer of other part of left foot limited to breakdown of skin: Secondary | ICD-10-CM | POA: Diagnosis not present

## 2015-04-12 DIAGNOSIS — R296 Repeated falls: Secondary | ICD-10-CM | POA: Diagnosis not present

## 2015-04-12 DIAGNOSIS — F330 Major depressive disorder, recurrent, mild: Secondary | ICD-10-CM | POA: Diagnosis not present

## 2015-04-12 DIAGNOSIS — E11621 Type 2 diabetes mellitus with foot ulcer: Secondary | ICD-10-CM | POA: Diagnosis not present

## 2015-04-12 DIAGNOSIS — N183 Chronic kidney disease, stage 3 (moderate): Secondary | ICD-10-CM | POA: Diagnosis not present

## 2015-04-12 DIAGNOSIS — S0180XD Unspecified open wound of other part of head, subsequent encounter: Secondary | ICD-10-CM | POA: Diagnosis not present

## 2015-04-12 DIAGNOSIS — E1122 Type 2 diabetes mellitus with diabetic chronic kidney disease: Secondary | ICD-10-CM | POA: Diagnosis not present

## 2015-04-12 DIAGNOSIS — R279 Unspecified lack of coordination: Secondary | ICD-10-CM | POA: Diagnosis not present

## 2015-04-12 DIAGNOSIS — I129 Hypertensive chronic kidney disease with stage 1 through stage 4 chronic kidney disease, or unspecified chronic kidney disease: Secondary | ICD-10-CM | POA: Diagnosis not present

## 2015-04-16 DIAGNOSIS — E1122 Type 2 diabetes mellitus with diabetic chronic kidney disease: Secondary | ICD-10-CM | POA: Diagnosis not present

## 2015-04-16 DIAGNOSIS — R279 Unspecified lack of coordination: Secondary | ICD-10-CM | POA: Diagnosis not present

## 2015-04-16 DIAGNOSIS — E11621 Type 2 diabetes mellitus with foot ulcer: Secondary | ICD-10-CM | POA: Diagnosis not present

## 2015-04-16 DIAGNOSIS — I129 Hypertensive chronic kidney disease with stage 1 through stage 4 chronic kidney disease, or unspecified chronic kidney disease: Secondary | ICD-10-CM | POA: Diagnosis not present

## 2015-04-16 DIAGNOSIS — S0180XD Unspecified open wound of other part of head, subsequent encounter: Secondary | ICD-10-CM | POA: Diagnosis not present

## 2015-04-16 DIAGNOSIS — L97521 Non-pressure chronic ulcer of other part of left foot limited to breakdown of skin: Secondary | ICD-10-CM | POA: Diagnosis not present

## 2015-04-16 DIAGNOSIS — R296 Repeated falls: Secondary | ICD-10-CM | POA: Diagnosis not present

## 2015-04-16 DIAGNOSIS — N183 Chronic kidney disease, stage 3 (moderate): Secondary | ICD-10-CM | POA: Diagnosis not present

## 2015-04-16 DIAGNOSIS — F330 Major depressive disorder, recurrent, mild: Secondary | ICD-10-CM | POA: Diagnosis not present

## 2015-04-17 DIAGNOSIS — N183 Chronic kidney disease, stage 3 (moderate): Secondary | ICD-10-CM | POA: Diagnosis not present

## 2015-04-17 DIAGNOSIS — E11621 Type 2 diabetes mellitus with foot ulcer: Secondary | ICD-10-CM | POA: Diagnosis not present

## 2015-04-17 DIAGNOSIS — I129 Hypertensive chronic kidney disease with stage 1 through stage 4 chronic kidney disease, or unspecified chronic kidney disease: Secondary | ICD-10-CM | POA: Diagnosis not present

## 2015-04-17 DIAGNOSIS — F330 Major depressive disorder, recurrent, mild: Secondary | ICD-10-CM | POA: Diagnosis not present

## 2015-04-17 DIAGNOSIS — R279 Unspecified lack of coordination: Secondary | ICD-10-CM | POA: Diagnosis not present

## 2015-04-17 DIAGNOSIS — R296 Repeated falls: Secondary | ICD-10-CM | POA: Diagnosis not present

## 2015-04-17 DIAGNOSIS — E1122 Type 2 diabetes mellitus with diabetic chronic kidney disease: Secondary | ICD-10-CM | POA: Diagnosis not present

## 2015-04-17 DIAGNOSIS — L97521 Non-pressure chronic ulcer of other part of left foot limited to breakdown of skin: Secondary | ICD-10-CM | POA: Diagnosis not present

## 2015-04-17 DIAGNOSIS — S0180XD Unspecified open wound of other part of head, subsequent encounter: Secondary | ICD-10-CM | POA: Diagnosis not present

## 2015-04-18 DIAGNOSIS — S0180XD Unspecified open wound of other part of head, subsequent encounter: Secondary | ICD-10-CM | POA: Diagnosis not present

## 2015-04-18 DIAGNOSIS — N183 Chronic kidney disease, stage 3 (moderate): Secondary | ICD-10-CM | POA: Diagnosis not present

## 2015-04-18 DIAGNOSIS — L97521 Non-pressure chronic ulcer of other part of left foot limited to breakdown of skin: Secondary | ICD-10-CM | POA: Diagnosis not present

## 2015-04-18 DIAGNOSIS — I129 Hypertensive chronic kidney disease with stage 1 through stage 4 chronic kidney disease, or unspecified chronic kidney disease: Secondary | ICD-10-CM | POA: Diagnosis not present

## 2015-04-18 DIAGNOSIS — F330 Major depressive disorder, recurrent, mild: Secondary | ICD-10-CM | POA: Diagnosis not present

## 2015-04-18 DIAGNOSIS — E11621 Type 2 diabetes mellitus with foot ulcer: Secondary | ICD-10-CM | POA: Diagnosis not present

## 2015-04-18 DIAGNOSIS — R296 Repeated falls: Secondary | ICD-10-CM | POA: Diagnosis not present

## 2015-04-18 DIAGNOSIS — R279 Unspecified lack of coordination: Secondary | ICD-10-CM | POA: Diagnosis not present

## 2015-04-18 DIAGNOSIS — E1122 Type 2 diabetes mellitus with diabetic chronic kidney disease: Secondary | ICD-10-CM | POA: Diagnosis not present

## 2015-04-19 DIAGNOSIS — E11319 Type 2 diabetes mellitus with unspecified diabetic retinopathy without macular edema: Secondary | ICD-10-CM | POA: Diagnosis not present

## 2015-04-20 DIAGNOSIS — E1122 Type 2 diabetes mellitus with diabetic chronic kidney disease: Secondary | ICD-10-CM | POA: Diagnosis not present

## 2015-04-20 DIAGNOSIS — F330 Major depressive disorder, recurrent, mild: Secondary | ICD-10-CM | POA: Diagnosis not present

## 2015-04-20 DIAGNOSIS — E11621 Type 2 diabetes mellitus with foot ulcer: Secondary | ICD-10-CM | POA: Diagnosis not present

## 2015-04-20 DIAGNOSIS — S0180XD Unspecified open wound of other part of head, subsequent encounter: Secondary | ICD-10-CM | POA: Diagnosis not present

## 2015-04-20 DIAGNOSIS — R279 Unspecified lack of coordination: Secondary | ICD-10-CM | POA: Diagnosis not present

## 2015-04-20 DIAGNOSIS — N183 Chronic kidney disease, stage 3 (moderate): Secondary | ICD-10-CM | POA: Diagnosis not present

## 2015-04-20 DIAGNOSIS — I129 Hypertensive chronic kidney disease with stage 1 through stage 4 chronic kidney disease, or unspecified chronic kidney disease: Secondary | ICD-10-CM | POA: Diagnosis not present

## 2015-04-20 DIAGNOSIS — R296 Repeated falls: Secondary | ICD-10-CM | POA: Diagnosis not present

## 2015-04-20 DIAGNOSIS — L97521 Non-pressure chronic ulcer of other part of left foot limited to breakdown of skin: Secondary | ICD-10-CM | POA: Diagnosis not present

## 2015-04-26 DIAGNOSIS — R296 Repeated falls: Secondary | ICD-10-CM | POA: Diagnosis not present

## 2015-04-26 DIAGNOSIS — N183 Chronic kidney disease, stage 3 (moderate): Secondary | ICD-10-CM | POA: Diagnosis not present

## 2015-04-26 DIAGNOSIS — R279 Unspecified lack of coordination: Secondary | ICD-10-CM | POA: Diagnosis not present

## 2015-04-26 DIAGNOSIS — S0180XD Unspecified open wound of other part of head, subsequent encounter: Secondary | ICD-10-CM | POA: Diagnosis not present

## 2015-04-26 DIAGNOSIS — E11621 Type 2 diabetes mellitus with foot ulcer: Secondary | ICD-10-CM | POA: Diagnosis not present

## 2015-04-26 DIAGNOSIS — E1122 Type 2 diabetes mellitus with diabetic chronic kidney disease: Secondary | ICD-10-CM | POA: Diagnosis not present

## 2015-04-26 DIAGNOSIS — I129 Hypertensive chronic kidney disease with stage 1 through stage 4 chronic kidney disease, or unspecified chronic kidney disease: Secondary | ICD-10-CM | POA: Diagnosis not present

## 2015-04-26 DIAGNOSIS — F330 Major depressive disorder, recurrent, mild: Secondary | ICD-10-CM | POA: Diagnosis not present

## 2015-04-26 DIAGNOSIS — L97521 Non-pressure chronic ulcer of other part of left foot limited to breakdown of skin: Secondary | ICD-10-CM | POA: Diagnosis not present

## 2015-05-03 DIAGNOSIS — E11621 Type 2 diabetes mellitus with foot ulcer: Secondary | ICD-10-CM | POA: Diagnosis not present

## 2015-05-03 DIAGNOSIS — R296 Repeated falls: Secondary | ICD-10-CM | POA: Diagnosis not present

## 2015-05-03 DIAGNOSIS — F330 Major depressive disorder, recurrent, mild: Secondary | ICD-10-CM | POA: Diagnosis not present

## 2015-05-03 DIAGNOSIS — N183 Chronic kidney disease, stage 3 (moderate): Secondary | ICD-10-CM | POA: Diagnosis not present

## 2015-05-03 DIAGNOSIS — R279 Unspecified lack of coordination: Secondary | ICD-10-CM | POA: Diagnosis not present

## 2015-05-03 DIAGNOSIS — I129 Hypertensive chronic kidney disease with stage 1 through stage 4 chronic kidney disease, or unspecified chronic kidney disease: Secondary | ICD-10-CM | POA: Diagnosis not present

## 2015-05-03 DIAGNOSIS — E1122 Type 2 diabetes mellitus with diabetic chronic kidney disease: Secondary | ICD-10-CM | POA: Diagnosis not present

## 2015-05-03 DIAGNOSIS — L97521 Non-pressure chronic ulcer of other part of left foot limited to breakdown of skin: Secondary | ICD-10-CM | POA: Diagnosis not present

## 2015-05-03 DIAGNOSIS — S0180XD Unspecified open wound of other part of head, subsequent encounter: Secondary | ICD-10-CM | POA: Diagnosis not present

## 2015-05-10 DIAGNOSIS — E11621 Type 2 diabetes mellitus with foot ulcer: Secondary | ICD-10-CM | POA: Diagnosis not present

## 2015-05-10 DIAGNOSIS — E1122 Type 2 diabetes mellitus with diabetic chronic kidney disease: Secondary | ICD-10-CM | POA: Diagnosis not present

## 2015-05-10 DIAGNOSIS — R279 Unspecified lack of coordination: Secondary | ICD-10-CM | POA: Diagnosis not present

## 2015-05-10 DIAGNOSIS — N183 Chronic kidney disease, stage 3 (moderate): Secondary | ICD-10-CM | POA: Diagnosis not present

## 2015-05-10 DIAGNOSIS — S0180XD Unspecified open wound of other part of head, subsequent encounter: Secondary | ICD-10-CM | POA: Diagnosis not present

## 2015-05-10 DIAGNOSIS — I129 Hypertensive chronic kidney disease with stage 1 through stage 4 chronic kidney disease, or unspecified chronic kidney disease: Secondary | ICD-10-CM | POA: Diagnosis not present

## 2015-05-10 DIAGNOSIS — L97521 Non-pressure chronic ulcer of other part of left foot limited to breakdown of skin: Secondary | ICD-10-CM | POA: Diagnosis not present

## 2015-05-10 DIAGNOSIS — R296 Repeated falls: Secondary | ICD-10-CM | POA: Diagnosis not present

## 2015-05-10 DIAGNOSIS — F330 Major depressive disorder, recurrent, mild: Secondary | ICD-10-CM | POA: Diagnosis not present

## 2015-05-14 DIAGNOSIS — H25042 Posterior subcapsular polar age-related cataract, left eye: Secondary | ICD-10-CM | POA: Diagnosis not present

## 2015-05-14 DIAGNOSIS — H25012 Cortical age-related cataract, left eye: Secondary | ICD-10-CM | POA: Diagnosis not present

## 2015-05-14 DIAGNOSIS — H26491 Other secondary cataract, right eye: Secondary | ICD-10-CM | POA: Diagnosis not present

## 2015-05-14 DIAGNOSIS — H2512 Age-related nuclear cataract, left eye: Secondary | ICD-10-CM | POA: Diagnosis not present

## 2015-05-14 DIAGNOSIS — H2589 Other age-related cataract: Secondary | ICD-10-CM | POA: Diagnosis not present

## 2015-05-22 DIAGNOSIS — H2512 Age-related nuclear cataract, left eye: Secondary | ICD-10-CM | POA: Diagnosis not present

## 2015-05-22 DIAGNOSIS — H2522 Age-related cataract, morgagnian type, left eye: Secondary | ICD-10-CM | POA: Diagnosis not present

## 2015-05-22 DIAGNOSIS — H2589 Other age-related cataract: Secondary | ICD-10-CM | POA: Diagnosis not present

## 2015-05-22 DIAGNOSIS — H538 Other visual disturbances: Secondary | ICD-10-CM | POA: Diagnosis not present

## 2015-06-04 DIAGNOSIS — H26491 Other secondary cataract, right eye: Secondary | ICD-10-CM | POA: Diagnosis not present

## 2015-06-11 DIAGNOSIS — E1122 Type 2 diabetes mellitus with diabetic chronic kidney disease: Secondary | ICD-10-CM | POA: Diagnosis not present

## 2015-06-11 DIAGNOSIS — E7800 Pure hypercholesterolemia, unspecified: Secondary | ICD-10-CM | POA: Diagnosis not present

## 2015-06-11 DIAGNOSIS — I10 Essential (primary) hypertension: Secondary | ICD-10-CM | POA: Diagnosis not present

## 2015-06-11 DIAGNOSIS — K210 Gastro-esophageal reflux disease with esophagitis: Secondary | ICD-10-CM | POA: Diagnosis not present

## 2015-06-11 DIAGNOSIS — E782 Mixed hyperlipidemia: Secondary | ICD-10-CM | POA: Diagnosis not present

## 2015-06-20 DIAGNOSIS — E782 Mixed hyperlipidemia: Secondary | ICD-10-CM | POA: Diagnosis not present

## 2015-06-20 DIAGNOSIS — R0602 Shortness of breath: Secondary | ICD-10-CM | POA: Diagnosis not present

## 2015-06-20 DIAGNOSIS — E1122 Type 2 diabetes mellitus with diabetic chronic kidney disease: Secondary | ICD-10-CM | POA: Diagnosis not present

## 2015-06-20 DIAGNOSIS — E038 Other specified hypothyroidism: Secondary | ICD-10-CM | POA: Diagnosis not present

## 2015-06-20 DIAGNOSIS — E7800 Pure hypercholesterolemia, unspecified: Secondary | ICD-10-CM | POA: Diagnosis not present

## 2015-06-20 DIAGNOSIS — K210 Gastro-esophageal reflux disease with esophagitis: Secondary | ICD-10-CM | POA: Diagnosis not present

## 2015-06-20 DIAGNOSIS — E1165 Type 2 diabetes mellitus with hyperglycemia: Secondary | ICD-10-CM | POA: Diagnosis not present

## 2015-06-20 DIAGNOSIS — I10 Essential (primary) hypertension: Secondary | ICD-10-CM | POA: Diagnosis not present

## 2015-06-20 DIAGNOSIS — F330 Major depressive disorder, recurrent, mild: Secondary | ICD-10-CM | POA: Diagnosis not present

## 2015-06-20 DIAGNOSIS — N183 Chronic kidney disease, stage 3 (moderate): Secondary | ICD-10-CM | POA: Diagnosis not present

## 2015-06-20 DIAGNOSIS — Z23 Encounter for immunization: Secondary | ICD-10-CM | POA: Diagnosis not present

## 2015-06-28 ENCOUNTER — Other Ambulatory Visit: Payer: Self-pay

## 2015-06-28 ENCOUNTER — Telehealth: Payer: Self-pay | Admitting: Vascular Surgery

## 2015-06-28 DIAGNOSIS — I96 Gangrene, not elsewhere classified: Secondary | ICD-10-CM

## 2015-06-28 DIAGNOSIS — R0989 Other specified symptoms and signs involving the circulatory and respiratory systems: Secondary | ICD-10-CM

## 2015-06-28 DIAGNOSIS — E1052 Type 1 diabetes mellitus with diabetic peripheral angiopathy with gangrene: Secondary | ICD-10-CM | POA: Diagnosis not present

## 2015-06-28 NOTE — Telephone Encounter (Signed)
LM with sister to have [facility] call when she gets in- trying to bring pt in tomorrow. dpm

## 2015-06-28 NOTE — Telephone Encounter (Signed)
-----   Message from Phillips Odorarol S Pullins, RN sent at 06/28/2015  3:36 PM EST ----- Regarding: Referral from Dr. Adam Phenixody Drake Contact: [phone] Referred by Dr. Ulice Brilliantrake for Dry Gangrene on end left 2nd toe; very weak pedal pulse; request to evaluate pt. next week.  Dr. Ulice Brilliantrake will fax his office dictation.  Pt. Needs ABI's and OV.  Can we work her in to schedule with any MD next week?

## 2015-06-29 ENCOUNTER — Encounter: Payer: Self-pay | Admitting: Surgery

## 2015-06-29 ENCOUNTER — Encounter: Payer: Self-pay | Admitting: Vascular Surgery

## 2015-06-29 ENCOUNTER — Encounter (HOSPITAL_COMMUNITY): Payer: Self-pay

## 2015-07-02 ENCOUNTER — Other Ambulatory Visit: Payer: Self-pay

## 2015-07-02 ENCOUNTER — Encounter: Payer: Self-pay | Admitting: Surgery

## 2015-07-02 ENCOUNTER — Ambulatory Visit (INDEPENDENT_AMBULATORY_CARE_PROVIDER_SITE_OTHER): Payer: Medicare PPO | Admitting: Surgery

## 2015-07-02 ENCOUNTER — Ambulatory Visit (INDEPENDENT_AMBULATORY_CARE_PROVIDER_SITE_OTHER)
Admission: RE | Admit: 2015-07-02 | Discharge: 2015-07-02 | Disposition: A | Discharge status: Home / Self Care | Payer: Medicare PPO | Source: Ambulatory Visit | Attending: Surgery | Admitting: Surgery

## 2015-07-02 ENCOUNTER — Ambulatory Visit (HOSPITAL_COMMUNITY)
Admission: RE | Admit: 2015-07-02 | Discharge: 2015-07-02 | Disposition: A | Discharge status: Home / Self Care | Payer: Medicare PPO | Source: Ambulatory Visit | Attending: Vascular Surgery | Admitting: Vascular Surgery

## 2015-07-02 ENCOUNTER — Other Ambulatory Visit: Payer: Self-pay | Admitting: Surgery

## 2015-07-02 VITALS — BP 189/65 | HR 63 | Temp 97.5°F | Ht 64.0 in | Wt 157.0 lb

## 2015-07-02 DIAGNOSIS — I82441 Acute embolism and thrombosis of right tibial vein: Secondary | ICD-10-CM | POA: Diagnosis not present

## 2015-07-02 DIAGNOSIS — I10 Essential (primary) hypertension: Secondary | ICD-10-CM | POA: Diagnosis not present

## 2015-07-02 DIAGNOSIS — I96 Gangrene, not elsewhere classified: Secondary | ICD-10-CM

## 2015-07-02 DIAGNOSIS — R0989 Other specified symptoms and signs involving the circulatory and respiratory systems: Secondary | ICD-10-CM | POA: Insufficient documentation

## 2015-07-02 DIAGNOSIS — E1152 Type 2 diabetes mellitus with diabetic peripheral angiopathy with gangrene: Secondary | ICD-10-CM | POA: Diagnosis not present

## 2015-07-02 MED ORDER — LEVOFLOXACIN 500 MG PO TABS: 500.0000 mg | ORAL_TABLET | ORAL | Status: DC

## 2015-07-02 NOTE — Progress Notes (Signed)
HISTORY AND PHYSICAL     CC:  Toe dying Referring Provider:  Drake, Cody, DPM  HPI: This is a 81 y.o. female who presents today from referral from Dr. Cody Drake, DPM for evaluation of blood flow in her left leg.  She and her family member state that about 6 months ago, she bumped her left 2nd toe and it has not healed.  It has progressively gotten worse.  There is no drainage from the wound.  She denies any fevers.  She states that she really only walks in the house and does not walk long distance.  She lives at home with her daughter.  She recently had cataract surgery on both eyes last month.  She states that her only medical problem is diabetes, which she takes Metformin and Glipizide.  She is on a beta blocker and ACEI for blood pressure control.   She takes a daily aspirin.    Past Medical History  Diagnosis Date  . Diabetes mellitus without complication (HCC)   . Hypertension   . Thyroid disease     Past Surgical History  Procedure Laterality Date  . Eye surgery      Allergies  Allergen Reactions  . Nsaids     " I feel out of it and have to go to the hospital"    Current Outpatient Prescriptions  Medication Sig Dispense Refill  . aspirin 81 MG tablet Take 81 mg by mouth daily.    . glipiZIDE (GLUCOTROL XL) 5 MG 24 hr tablet     . levothyroxine (SYNTHROID, LEVOTHROID) 100 MCG tablet     . lisinopril-hydrochlorothiazide (PRINZIDE,ZESTORETIC) 20-12.5 MG tablet     . metFORMIN (GLUCOPHAGE-XR) 500 MG 24 hr tablet     . metoprolol tartrate (LOPRESSOR) 25 MG tablet     . sertraline (ZOLOFT) 50 MG tablet      No current facility-administered medications for this visit.    No family history on file.  Social History   Social History  . Marital Status: Married    Spouse Name: N/A  . Number of Children: N/A  . Years of Education: N/A   Occupational History  . Not on file.   Social History Main Topics  . Smoking status: Never Smoker   . Smokeless tobacco: Never  Used  . Alcohol Use: No  . Drug Use: No  . Sexual Activity: Not on file   Other Topics Concern  . Not on file   Social History Narrative  . No narrative on file     ROS: [x] Positive   [ ] Negative   [ ] All sytems reviewed and are negative  Cardiovascular: [] chest pain/pressure [] palpitations [] SOB lying flat [x] DOE [] pain in legs while walking [x] pain in feet that wakes you at night [] hx of DVT [] hx of phlebitis [] swelling in legs [] varicose veins  Pulmonary: [] productive cough [] asthma [] wheezing  Neurologic: [] weakness in [] arms [] legs [] numbness in [] arms [] legs []difficulty speaking or slurred speech [] temporary loss of vision in one eye [] dizziness  Hematologic: [] bleeding problems [] problems with blood clotting easily  GI [] vomiting blood [] blood in stool  GU: [] burning with urination [] blood in urine  Psychiatric: [] hx of major depression  Integumentary: [] rashes [] ulcers  Constitutional: [] fever [] chills   PHYSICAL EXAMINATION:  Filed Vitals:   07/02/15 1419 07/02/15 1424    BP: 170/97 189/65  Pulse: 63   Temp: 97.5 F (36.4 C)    Body mass index is 26.94 kg/(m^2).  General:  WDWN in NAD Gait: Not observed HENT: WNL, normocephalic Pulmonary: normal non-labored breathing , without Rales, rhonchi,  wheezing Cardiac: RRR, without  Murmurs, rubs or gallops; without carotid bruits Skin: without rashes Vascular Exam/Pulses:  Right Left  Radial 2+ (normal) 2+ (normal)  Femoral Unable to palpate due to body habitus  Unable to palpate due to body habitus   Popliteal Unable to palpate  Unable to palpate   DP 2+ (normal) Unable to palpate   PT Unable to palpate  Unable to palpate    Extremities: with dry gangrenous left 2nd toe that is malodorous.  There is some swelling in the left foot.   Musculoskeletal: no muscle wasting or atrophy  Neurologic: A&O X 3; Appropriate Affect ; SENSATION:  normal; MOTOR FUNCTION:  moving all extremities equally. Speech is fluent/normal   Non-Invasive Vascular Imaging:   ABI's 07/02/15: Right:  1.1 Left:  0.34  TBI's 07/02/15: Right:  > 0.70 Left:  No signal was obtainable in left great toe  Lower Extremity Arterial Duplex 07/02/15: The left popliteal, PT, peroneal, and proximal AT vessels demonstrate no flow consistent with occlusion of the these vessels.  There is a small collateral vessel seen branching off the proximal popliteal which appears to course down the anterior-medial calf.  The mid anterior tibial vessel demonstrates reconstitution with flow noted into the DP artery.   Pt meds includes: Statin:  No. Beta Blocker:  Yes.   Aspirin:  Yes.   ACEI:  Yes.   ARB:  No. Other Antiplatelet/Anticoagulant:  No.    ASSESSMENT/PLAN:: 81 y.o. female with a gangrenous left 2nd toe   -Dr. Myra Gianotti has reviewed the arterial duplex/ABI's from today.  She has an ABI of 0.33 on the left and occluded popliteal, PT, peroneal, and proximal AT.  She does have one small collateral artery noted.  She does not have sufficient flow to heal her toe and is potentially at risk for higher amputation in the future. -will set up for aortogram with BLE runoff and possible left leg intervention on 07/17/15 by Dr. Myra Gianotti.   -Rx for Levaquin  every other day (for gangrenous toe) sent electronically to [facility] pharmacy in [place].  I did discuss dosing of this with Crystal, pharmacist at [facility] for dosing Levaquin every other day given her age and hypoglycemia.    Doreatha Massed, PA-C [facility] [phone]  Clinic MD:  Pt seen and examined in conjunction with Dr. Myra Gianotti  I agree with the above.  I have seen and evaluated the patient.  She has a gangrenous left toe which has been present for some time.  She had a ultrasound today which showed severe tibial vessel occlusion.  The patient is a diabetic.  She does have erythema around the  base of her foot.  I discussed with the patient that she is at high risk for limb loss.  I think we need to proceed with an attempted revascularization.  Because of her age and overall health, she is not an operative candidate but potentially could be a candidate for endovascular repair.  I have put her on the schedule for Tuesday, December 27 for an arteriogram with intervention if possible.  This will be done through a right femoral approach.  If this is successful, Dr. Ulice Brilliant can proceed with toe amputation.  If unsuccessful she will likely  need a more proximal amputation  [place] Jeanna Giuffre

## 2015-07-17 ENCOUNTER — Encounter (HOSPITAL_COMMUNITY): Payer: Self-pay | Admitting: Surgery

## 2015-07-17 ENCOUNTER — Other Ambulatory Visit: Payer: Self-pay | Admitting: *Deleted

## 2015-07-17 ENCOUNTER — Ambulatory Visit (HOSPITAL_COMMUNITY)
Admission: RE | Admit: 2015-07-17 | Discharge: 2015-07-17 | Disposition: A | Discharge status: Home / Self Care | Payer: Medicare PPO | Source: Ambulatory Visit | Attending: Surgery | Admitting: Surgery

## 2015-07-17 ENCOUNTER — Encounter (HOSPITAL_COMMUNITY)
Admission: RE | Disposition: A | Discharge status: Home / Self Care | Payer: Medicare PPO | Source: Ambulatory Visit | Attending: Surgery

## 2015-07-17 DIAGNOSIS — I70245 Atherosclerosis of native arteries of left leg with ulceration of other part of foot: Secondary | ICD-10-CM | POA: Diagnosis not present

## 2015-07-17 DIAGNOSIS — Z7982 Long term (current) use of aspirin: Secondary | ICD-10-CM | POA: Diagnosis not present

## 2015-07-17 DIAGNOSIS — Z7984 Long term (current) use of oral hypoglycemic drugs: Secondary | ICD-10-CM | POA: Diagnosis not present

## 2015-07-17 DIAGNOSIS — E079 Disorder of thyroid, unspecified: Secondary | ICD-10-CM | POA: Insufficient documentation

## 2015-07-17 DIAGNOSIS — I739 Peripheral vascular disease, unspecified: Secondary | ICD-10-CM

## 2015-07-17 DIAGNOSIS — L97529 Non-pressure chronic ulcer of other part of left foot with unspecified severity: Secondary | ICD-10-CM | POA: Insufficient documentation

## 2015-07-17 DIAGNOSIS — I10 Essential (primary) hypertension: Secondary | ICD-10-CM | POA: Insufficient documentation

## 2015-07-17 DIAGNOSIS — E1152 Type 2 diabetes mellitus with diabetic peripheral angiopathy with gangrene: Secondary | ICD-10-CM | POA: Diagnosis not present

## 2015-07-17 DIAGNOSIS — E11621 Type 2 diabetes mellitus with foot ulcer: Secondary | ICD-10-CM | POA: Diagnosis not present

## 2015-07-17 DIAGNOSIS — Z9862 Peripheral vascular angioplasty status: Secondary | ICD-10-CM

## 2015-07-17 HISTORY — PX: PERIPHERAL VASCULAR CATHETERIZATION: SHX172C

## 2015-07-17 LAB — POCT I-STAT, CHEM 8
BUN: 34 mg/dL — ABNORMAL HIGH (ref 6–20)
CALCIUM ION: 0.99 mmol/L — AB (ref 1.13–1.30)
Chloride: 106 mmol/L (ref 101–111)
Creatinine, Ser: 1.4 mg/dL — ABNORMAL HIGH (ref 0.44–1.00)
Glucose, Bld: 131 mg/dL — ABNORMAL HIGH (ref 65–99)
HEMATOCRIT: 36 % (ref 36.0–46.0)
HEMOGLOBIN: 12.2 g/dL (ref 12.0–15.0)
Potassium: 3.5 mmol/L (ref 3.5–5.1)
SODIUM: 137 mmol/L (ref 135–145)
TCO2: 18 mmol/L (ref 0–100)

## 2015-07-17 LAB — POCT ACTIVATED CLOTTING TIME
ACTIVATED CLOTTING TIME: 198 s
ACTIVATED CLOTTING TIME: 209 s
ACTIVATED CLOTTING TIME: 183 s
Activated Clotting Time: 198 seconds

## 2015-07-17 LAB — GLUCOSE, CAPILLARY: GLUCOSE-CAPILLARY: 102 mg/dL — AB (ref 65–99)

## 2015-07-17 SURGERY — ABDOMINAL AORTOGRAM W/LOWER EXTREMITY
Anesthesia: LOCAL

## 2015-07-17 MED ORDER — FENTANYL CITRATE (PF) 100 MCG/2ML IJ SOLN
INTRAMUSCULAR | Status: DC | PRN
  Administered 2015-07-17 (×2): 25 ug via INTRAVENOUS

## 2015-07-17 MED ORDER — ONDANSETRON HCL 4 MG/2ML IJ SOLN
INTRAMUSCULAR | Status: DC | PRN
  Administered 2015-07-17: 4 mg via INTRAVENOUS

## 2015-07-17 MED ORDER — ONDANSETRON HCL 4 MG/2ML IJ SOLN: 4.0000 mg | Freq: Four times a day (QID) | INTRAMUSCULAR | Status: DC | PRN

## 2015-07-17 MED ORDER — SODIUM CHLORIDE 0.9 % IV SOLN
INTRAVENOUS | Status: DC
  Administered 2015-07-17: 08:00:00 via INTRAVENOUS

## 2015-07-17 MED ORDER — LIDOCAINE HCL (PF) 1 % IJ SOLN
INTRAMUSCULAR | Status: AC
  Filled 2015-07-17: qty 30

## 2015-07-17 MED ORDER — GUAIFENESIN-DM 100-10 MG/5ML PO SYRP
15.0000 mL | ORAL_SOLUTION | ORAL | Status: DC | PRN
  Filled 2015-07-17: qty 15

## 2015-07-17 MED ORDER — SODIUM CHLORIDE 0.9 % IV SOLN: 1.0000 mL/kg/h | INTRAVENOUS | Status: DC

## 2015-07-17 MED ORDER — DOCUSATE SODIUM 100 MG PO CAPS: 100.0000 mg | ORAL_CAPSULE | Freq: Every day | ORAL | Status: DC

## 2015-07-17 MED ORDER — FENTANYL CITRATE (PF) 100 MCG/2ML IJ SOLN
INTRAMUSCULAR | Status: AC
  Filled 2015-07-17: qty 2

## 2015-07-17 MED ORDER — ONDANSETRON HCL 4 MG/2ML IJ SOLN
INTRAMUSCULAR | Status: AC
  Filled 2015-07-17: qty 2

## 2015-07-17 MED ORDER — ACETAMINOPHEN 325 MG PO TABS
325.0000 mg | ORAL_TABLET | ORAL | Status: DC | PRN
  Filled 2015-07-17: qty 2

## 2015-07-17 MED ORDER — ALUM & MAG HYDROXIDE-SIMETH 200-200-20 MG/5ML PO SUSP
15.0000 mL | ORAL | Status: DC | PRN
  Filled 2015-07-17: qty 30

## 2015-07-17 MED ORDER — HEPARIN (PORCINE) IN NACL 2-0.9 UNIT/ML-% IJ SOLN
INTRAMUSCULAR | Status: AC
  Filled 2015-07-17: qty 1000

## 2015-07-17 MED ORDER — IODIXANOL 320 MG/ML IV SOLN
INTRAVENOUS | Status: DC | PRN
  Administered 2015-07-17: 175 mL via INTRA_ARTERIAL

## 2015-07-17 MED ORDER — PHENOL 1.4 % MT LIQD
1.0000 | OROMUCOSAL | Status: DC | PRN
  Filled 2015-07-17: qty 177

## 2015-07-17 MED ORDER — HYDRALAZINE HCL 20 MG/ML IJ SOLN: 5.0000 mg | INTRAMUSCULAR | Status: DC | PRN

## 2015-07-17 MED ORDER — HEPARIN SODIUM (PORCINE) 1000 UNIT/ML IJ SOLN
INTRAMUSCULAR | Status: AC
  Filled 2015-07-17: qty 1

## 2015-07-17 MED ORDER — ACETAMINOPHEN 325 MG RE SUPP
325.0000 mg | RECTAL | Status: DC | PRN
  Filled 2015-07-17: qty 2

## 2015-07-17 MED ORDER — METOPROLOL TARTRATE 1 MG/ML IV SOLN: 2.0000 mg | INTRAVENOUS | Status: DC | PRN

## 2015-07-17 MED ORDER — HEPARIN SODIUM (PORCINE) 1000 UNIT/ML IJ SOLN
INTRAMUSCULAR | Status: DC | PRN
  Administered 2015-07-17: 2000 [IU] via INTRAVENOUS
  Administered 2015-07-17: 7000 [IU] via INTRAVENOUS

## 2015-07-17 MED ORDER — LABETALOL HCL 5 MG/ML IV SOLN: 10.0000 mg | INTRAVENOUS | Status: DC | PRN

## 2015-07-17 SURGICAL SUPPLY — 30 items
BALLN COYOTE OTW 2X60X150 (BALLOONS) ×3
BALLN COYOTE OTW 3X100X150 (BALLOONS) ×3
BALLN STERLING SL OTW 2X80X150 (BALLOONS) ×3
BALLOON COYOTE OTW 2X60X150 (BALLOONS) ×1 IMPLANT
BALLOON COYOTE OTW 3X100X150 (BALLOONS) ×1 IMPLANT
BALLOON STRLNG SL OTW 2X80X150 (BALLOONS) ×1 IMPLANT
CATH OMNI FLUSH 5F 65CM (CATHETERS) ×2 IMPLANT
CATH QUICKCROSS .035X135CM (MICROCATHETER) ×1 IMPLANT
CATH SOFT-VU 4F 65 STRAIGHT (CATHETERS) ×1 IMPLANT
CATH SOFT-VU STRAIGHT 4F 65CM (CATHETERS) ×3
COVER PRB 48X5XTLSCP FOLD TPE (BAG) IMPLANT
COVER PROBE 5X48 (BAG) ×3
DEVICE CONTINUOUS FLUSH (MISCELLANEOUS) ×1 IMPLANT
DEVICE TORQUE H2O (MISCELLANEOUS) ×2 IMPLANT
DRAPE ZERO GRAVITY STERILE (DRAPES) ×2 IMPLANT
GUIDEWIRE ANGLED .035X150CM (WIRE) ×2 IMPLANT
GUIDEWIRE ANGLED .035X260CM (WIRE) ×1 IMPLANT
KIT ENCORE 26 ADVANTAGE (KITS) ×4 IMPLANT
KIT MICROINTRODUCER STIFF 5F (SHEATH) ×1 IMPLANT
KIT PV (KITS) ×3 IMPLANT
SHEATH PINNACLE 5F 10CM (SHEATH) ×2 IMPLANT
SHEATH PINNACLE ST 6F 65CM (SHEATH) ×2 IMPLANT
SHIELD RADPAD SCOOP 12X17 (MISCELLANEOUS) ×1 IMPLANT
SYR MEDRAD MARK V 150ML (SYRINGE) ×3 IMPLANT
TRANSDUCER W/STOPCOCK (MISCELLANEOUS) ×3 IMPLANT
TRAY PV CATH (CUSTOM PROCEDURE TRAY) ×3 IMPLANT
WIRE BENTSON .035X145CM (WIRE) ×1 IMPLANT
WIRE ROSEN-J .035X180CM (WIRE) ×2 IMPLANT
WIRE SPARTACORE .014X300CM (WIRE) ×1 IMPLANT
WIRE TREASURE-12 .018X300CM (WIRE) ×2 IMPLANT

## 2015-07-17 NOTE — Discharge Instructions (Signed)
NO METFORMIN FOR 2 DAYS ° ° °Angiogram, Care After °These instructions give you information about caring for yourself after your procedure. Your doctor may also give you more specific instructions. Call your doctor if you have any problems or questions after your procedure.  °HOME CARE °· Take medicines only as told by your doctor. °· Follow your doctor's instructions about: °¨ Care of the area where the tube was inserted. °¨ Bandage (dressing) changes and removal. °· You may shower 24-48 hours after the procedure or as told by your doctor. °· Do not take baths, swim, or use a hot tub until your doctor approves. °· Every day, check the area where the tube was inserted. Watch for: °¨ Redness, swelling, or pain. °¨ Fluid, blood, or pus. °· Do not apply powder or lotion to the site. °· Do not lift anything that is heavier than 10 lb (4.5 kg) for 5 days or as told by your doctor. °· Ask your doctor when you can: °¨ Return to work or school. °¨ Do physical activities or play sports. °¨ Have sex. °· Do not drive or operate heavy machinery for 24 hours or as told by your doctor. °· Have someone with you for the first 24 hours after the procedure. °· Keep all follow-up visits as told by your doctor. This is important. °GET HELP IF: °· You have a fever.   °· You have chills.   °· You have more bleeding from the area where the tube was inserted. Hold pressure on the area. °· You have redness, swelling, or pain in the area where the tube was inserted. °· You have fluid or pus coming from the area. °GET HELP RIGHT AWAY IF:  °· You have a lot of pain in the area where the tube was inserted. °· The area where the tube was inserted is bleeding, and the bleeding does not stop after 30 minutes of holding steady pressure on the area. °· The area near or just beyond the insertion site becomes pale, cool, tingly, or numb. °  °This information is not intended to replace advice given to you by your health care provider. Make sure you  discuss any questions you have with your health care provider. °  °Document Released: 10/03/2008 Document Revised: 07/28/2014 Document Reviewed: 12/08/2012 °Elsevier Interactive Patient Education ©2016 [employer] ° °

## 2015-07-17 NOTE — Interval H&P Note (Signed)
History and Physical Interval Note:  07/17/2015 8:02 AM  Sonya Barnett  has presented today for surgery, with the diagnosis of gangrene lt 1st toe  The various methods of treatment have been discussed with the patient and family. After consideration of risks, benefits and other options for treatment, the patient has consented to  Procedure(s): Abdominal Aortogram w/Lower Extremity (N/A) as a surgical intervention .  The patient's history has been reviewed, patient examined, no change in status, stable for surgery.  I have reviewed the patient's chart and labs.  Questions were answered to the patient's satisfaction.     Durene CalBrabham, [place]

## 2015-07-17 NOTE — Progress Notes (Signed)
Site area: right groin a 6 french arterial sheath was removed  Site Prior to Removal:  Level 0  Pressure Applied For 15 MINUTES    Minutes Beginning at 1245p  Manual:   Yes.    Patient Status During Pull:  stable  Post Pull Groin Site:  Level 0  Post Pull Instructions Given:  Yes.    Post Pull Pulses Present:  Yes.    Dressing Applied:  Yes.    Comments:  VS  Remain stable during sheath pull

## 2015-07-17 NOTE — H&P (View-Only) (Signed)
HISTORY AND PHYSICAL     CC:  Toe dying Referring Provider:  Adam Phenix, DPM  HPI: This is a 81 y.o. female who presents today from referral from Dr. Adam Phenix, DPM for evaluation of blood flow in her left leg.  She and her family member state that about 6 months ago, she bumped her left 2nd toe and it has not healed.  It has progressively gotten worse.  There is no drainage from the wound.  She denies any fevers.  She states that she really only walks in the house and does not walk long distance.  She lives at home with her daughter.  She recently had cataract surgery on both eyes last month.  She states that her only medical problem is diabetes, which she takes Metformin and Glipizide.  She is on a beta blocker and ACEI for blood pressure control.   She takes a daily aspirin.    Past Medical History  Diagnosis Date  . Diabetes mellitus without complication (HCC)   . Hypertension   . Thyroid disease     Past Surgical History  Procedure Laterality Date  . Eye surgery      Allergies  Allergen Reactions  . Nsaids     " I feel out of it and have to go to the hospital"    Current Outpatient Prescriptions  Medication Sig Dispense Refill  . aspirin 81 MG tablet Take 81 mg by mouth daily.    Marland Kitchen glipiZIDE (GLUCOTROL XL) 5 MG 24 hr tablet     . levothyroxine (SYNTHROID, LEVOTHROID) 100 MCG tablet     . lisinopril-hydrochlorothiazide (PRINZIDE,ZESTORETIC) 20-12.5 MG tablet     . metFORMIN (GLUCOPHAGE-XR) 500 MG 24 hr tablet     . metoprolol tartrate (LOPRESSOR) 25 MG tablet     . sertraline (ZOLOFT) 50 MG tablet      No current facility-administered medications for this visit.    No family history on file.  Social History   Social History  . Marital Status: Married    Spouse Name: N/A  . Number of Children: N/A  . Years of Education: N/A   Occupational History  . Not on file.   Social History Main Topics  . Smoking status: Never Smoker   . Smokeless tobacco: Never  Used  . Alcohol Use: No  . Drug Use: No  . Sexual Activity: Not on file   Other Topics Concern  . Not on file   Social History Narrative  . No narrative on file     ROS:  Positive    Negative    All sytems reviewed and are negative  Cardiovascular:  chest pain/pressure  palpitations  SOB lying flat  DOE  pain in legs while walking  pain in feet that wakes you at night  hx of DVT  hx of phlebitis  swelling in legs  varicose veins  Pulmonary:  productive cough  asthma  wheezing  Neurologic:  weakness in  arms  legs  numbness in  arms  legs difficulty speaking or slurred speech  temporary loss of vision in one eye  dizziness  Hematologic:  bleeding problems  problems with blood clotting easily  GI  vomiting blood  blood in stool  GU:  burning with urination  blood in urine  Psychiatric:  hx of major depression  Integumentary:  rashes  ulcers  Constitutional:  fever  chills   PHYSICAL EXAMINATION:  Filed Vitals:   07/02/15 1419 07/02/15 1424  BP: 170/97 189/65  Pulse: 63   Temp: 97.5 F (36.4 C)    Body mass index is 26.94 kg/(m^2).  General:  WDWN in NAD Gait: Not observed HENT: WNL, normocephalic Pulmonary: normal non-labored breathing , without Rales, rhonchi,  wheezing Cardiac: RRR, without  Murmurs, rubs or gallops; without carotid bruits Skin: without rashes Vascular Exam/Pulses:  Right Left  Radial 2+ (normal) 2+ (normal)  Femoral Unable to palpate due to body habitus  Unable to palpate due to body habitus   Popliteal Unable to palpate  Unable to palpate   DP 2+ (normal) Unable to palpate   PT Unable to palpate  Unable to palpate    Extremities: with dry gangrenous left 2nd toe that is malodorous.  There is some swelling in the left foot.   Musculoskeletal: no muscle wasting or atrophy  Neurologic: A&O X 3; Appropriate Affect ; SENSATION:  normal; MOTOR FUNCTION:  moving all extremities equally. Speech is fluent/normal   Non-Invasive Vascular Imaging:   ABI's 07/02/15: Right:  1.1 Left:  0.34  TBI's 07/02/15: Right:  > 0.70 Left:  No signal was obtainable in left great toe  Lower Extremity Arterial Duplex 07/02/15: The left popliteal, PT, peroneal, and proximal AT vessels demonstrate no flow consistent with occlusion of the these vessels.  There is a small collateral vessel seen branching off the proximal popliteal which appears to course down the anterior-medial calf.  The mid anterior tibial vessel demonstrates reconstitution with flow noted into the DP artery.   Pt meds includes: Statin:  No. Beta Blocker:  Yes.   Aspirin:  Yes.   ACEI:  Yes.   ARB:  No. Other Antiplatelet/Anticoagulant:  No.    ASSESSMENT/PLAN:: 81 y.o. female with a gangrenous left 2nd toe   -Dr. Myra Gianotti has reviewed the arterial duplex/ABI's from today.  She has an ABI of 0.33 on the left and occluded popliteal, PT, peroneal, and proximal AT.  She does have one small collateral artery noted.  She does not have sufficient flow to heal her toe and is potentially at risk for higher amputation in the future. -will set up for aortogram with BLE runoff and possible left leg intervention on 07/17/15 by Dr. Myra Gianotti.   -Rx for Levaquin  every other day (for gangrenous toe) sent electronically to [facility] pharmacy in [place].  I did discuss dosing of this with Crystal, pharmacist at [facility] for dosing Levaquin every other day given her age and hypoglycemia.    Doreatha Massed, PA-C [facility] [phone]  Clinic MD:  Pt seen and examined in conjunction with Dr. Myra Gianotti  I agree with the above.  I have seen and evaluated the patient.  She has a gangrenous left toe which has been present for some time.  She had a ultrasound today which showed severe tibial vessel occlusion.  The patient is a diabetic.  She does have erythema around the  base of her foot.  I discussed with the patient that she is at high risk for limb loss.  I think we need to proceed with an attempted revascularization.  Because of her age and overall health, she is not an operative candidate but potentially could be a candidate for endovascular repair.  I have put her on the schedule for Tuesday, December 27 for an arteriogram with intervention if possible.  This will be done through a right femoral approach.  If this is successful, Dr. Ulice Brilliant can proceed with toe amputation.  If unsuccessful she will likely  need a more proximal amputation  Wells Brabham

## 2015-07-17 NOTE — Op Note (Signed)
Patient name: Sonya Barnett MRN: [id number] DOB: 06/02/1924 Sex: female  07/17/2015 Pre-operative Diagnosis: Left foot ulcer Post-operative diagnosis:  Same Surgeon:  Durene CalBrabham, Wells Procedure Performed:  1.  Ultrasound-guided access, right femoral artery  2.  Abdominal aortogram  3.  Left lower extremity runoff  4.  Additional order catheterization (anterior tibial artery  5.  Angioplasty, left popliteal artery  6.  Angioplasty, left anterior tibial artery   Indications:  The patient has ischemic changes to her toe on her left foot.  Ultrasound evaluation revealed severe occlusive disease below the knee.  She is here for further evaluation.  Procedure:  The patient was identified in the holding area and taken to room 8.  The patient was then placed supine on the table and prepped and draped in the usual sterile fashion.  A time out was called.  Ultrasound was used to evaluate the right common femoral artery.  It was patent .  A digital ultrasound image was acquired.  A micropuncture needle was used to access the right common femoral artery under ultrasound guidance.  An 018 wire was advanced without resistance and a micropuncture sheath was placed.  The 018 wire was removed and a benson wire was placed.  The micropuncture sheath was exchanged for a 5 french sheath.  An omniflush catheter was advanced over the wire to the level of L-1.  An abdominal angiogram was obtained.  Next, using the omniflush catheter and a benson wire, followed by Glidewire and a 4 French straight catheter were used to get access into the left external iliac artery and left lower extremity runoff was performed  Findings:   Aortogram:  No significant renal artery stenosis was identified.  The infrarenal abdominal aorta is widely patent.  Bilateral common and external iliac arteries are widely patent  Right Lower Extremity:  Not evaluated  Left Lower Extremity:  Left common femoral, superficial femoral, and profunda  femoral artery are widely patent.  There is mild calcification within the superficial femoral artery.  The popliteal artery occludes just above the joint space.  There is reconstitution at the level of the anterior tibial artery origin.  The anterior tibial artery occludes mid way down the leg.  The posterior tibial artery is occluded.  The peroneal artery is the dominant runoff to the ankle where a collateralizes.  There is diffuse vascular disease out onto the foot.  Intervention:  After the above images were acquired, the decision was made to proceed with intervention.  Using the Bentson wire, a 6 French 65 cm sheath was advanced into the mid superficial femoral artery on the left.  The patient was fully heparinized.  Heparin we dosing was performed throughout the procedure based on ACT monitoring.  Subintimal recanalization was attempted using multiple catheters and wires.  My original intent was to cross into the peroneal artery, however this was unsuccessful.  The wire did however advanced into the anterior tibial artery.  A contrast injection was performed with the catheter tip in the anterior tibial artery, confirming successful crossing of the lesion.  I then selected a 3 x 80 014 coyote balloon.  Balloon angioplasty of the popliteal and anterior tibial artery were then performed with resolution of the stenosis and no significant dissection.  I did after this maneuver identify a stop of the peroneal artery and tried to reenter but again was unsuccessful.  At this point I felt proceeding with required to much additional contrast.  I elected to terminate the procedure  at this point.  Catheters and wires were removed.  The sheath was withdrawn to the right external iliac artery where will be removed in the holding area once the ACT corrects  Impression:  #1  no significant aortoiliac occlusive disease  #2  occluded left popliteal artery at the joint space.  There was reconstitution of the peroneal artery  at its origin which is the dominant runoff vessel.  #3  successful subintimal recanalization and subsequent balloon angioplasty and occluded popliteal artery and anterior tibial artery, with no significant residual stenosis  #4  unable to gain reentry into the peroneal artery.  #5  would recommend proceeding with toe amputation.  If this does not heal consideration for repeat angiography and attempted peroneal recanalization could be performed, however the patient does have diffuse disease out on her foot and depending on the appearance of the leg she may require a more proximal amputation   V. Durene Cal, M.D. [facility] [place] Office: [phone] Pager:  [phone]

## 2015-07-18 MED FILL — Lidocaine HCl Local Preservative Free (PF) Inj 1%: INTRAMUSCULAR | Qty: 30 | Status: AC

## 2015-07-18 MED FILL — Heparin Sodium (Porcine) 2 Unit/ML in Sodium Chloride 0.9%: INTRAMUSCULAR | Qty: 1000 | Status: AC

## 2015-08-10 ENCOUNTER — Other Ambulatory Visit: Payer: Self-pay | Admitting: *Deleted

## 2015-08-20 ENCOUNTER — Encounter: Payer: Medicare PPO | Admitting: Surgery

## 2015-08-20 ENCOUNTER — Encounter (HOSPITAL_COMMUNITY): Payer: Medicare PPO

## 2015-09-02 DIAGNOSIS — L03032 Cellulitis of left toe: Secondary | ICD-10-CM | POA: Diagnosis not present

## 2015-09-02 DIAGNOSIS — M6281 Muscle weakness (generalized): Secondary | ICD-10-CM | POA: Diagnosis not present

## 2015-09-02 DIAGNOSIS — E039 Hypothyroidism, unspecified: Secondary | ICD-10-CM | POA: Diagnosis not present

## 2015-09-02 DIAGNOSIS — E119 Type 2 diabetes mellitus without complications: Secondary | ICD-10-CM | POA: Diagnosis not present

## 2015-09-02 DIAGNOSIS — E1169 Type 2 diabetes mellitus with other specified complication: Secondary | ICD-10-CM | POA: Diagnosis not present

## 2015-09-02 DIAGNOSIS — D649 Anemia, unspecified: Secondary | ICD-10-CM | POA: Diagnosis not present

## 2015-09-02 DIAGNOSIS — M86672 Other chronic osteomyelitis, left ankle and foot: Secondary | ICD-10-CM | POA: Diagnosis not present

## 2015-09-02 DIAGNOSIS — I739 Peripheral vascular disease, unspecified: Secondary | ICD-10-CM | POA: Diagnosis not present

## 2015-09-02 DIAGNOSIS — Z4781 Encounter for orthopedic aftercare following surgical amputation: Secondary | ICD-10-CM | POA: Diagnosis not present

## 2015-09-02 DIAGNOSIS — K219 Gastro-esophageal reflux disease without esophagitis: Secondary | ICD-10-CM | POA: Diagnosis not present

## 2015-09-02 DIAGNOSIS — I96 Gangrene, not elsewhere classified: Secondary | ICD-10-CM | POA: Diagnosis not present

## 2015-09-02 DIAGNOSIS — R2689 Other abnormalities of gait and mobility: Secondary | ICD-10-CM | POA: Diagnosis not present

## 2015-09-02 DIAGNOSIS — E1151 Type 2 diabetes mellitus with diabetic peripheral angiopathy without gangrene: Secondary | ICD-10-CM | POA: Diagnosis not present

## 2015-09-02 DIAGNOSIS — R41841 Cognitive communication deficit: Secondary | ICD-10-CM | POA: Diagnosis not present

## 2015-09-02 DIAGNOSIS — L03116 Cellulitis of left lower limb: Secondary | ICD-10-CM | POA: Diagnosis not present

## 2015-09-02 DIAGNOSIS — Z89422 Acquired absence of other left toe(s): Secondary | ICD-10-CM | POA: Diagnosis not present

## 2015-09-02 DIAGNOSIS — M869 Osteomyelitis, unspecified: Secondary | ICD-10-CM | POA: Diagnosis not present

## 2015-09-02 DIAGNOSIS — M868X7 Other osteomyelitis, ankle and foot: Secondary | ICD-10-CM | POA: Diagnosis not present

## 2015-09-02 DIAGNOSIS — F411 Generalized anxiety disorder: Secondary | ICD-10-CM | POA: Diagnosis not present

## 2015-09-02 DIAGNOSIS — E1152 Type 2 diabetes mellitus with diabetic peripheral angiopathy with gangrene: Secondary | ICD-10-CM | POA: Diagnosis not present

## 2015-09-02 DIAGNOSIS — I10 Essential (primary) hypertension: Secondary | ICD-10-CM | POA: Diagnosis not present

## 2015-09-03 DIAGNOSIS — M869 Osteomyelitis, unspecified: Secondary | ICD-10-CM | POA: Diagnosis not present

## 2015-09-06 DIAGNOSIS — Z8631 Personal history of diabetic foot ulcer: Secondary | ICD-10-CM | POA: Diagnosis not present

## 2015-09-06 DIAGNOSIS — F411 Generalized anxiety disorder: Secondary | ICD-10-CM | POA: Diagnosis not present

## 2015-09-06 DIAGNOSIS — E1151 Type 2 diabetes mellitus with diabetic peripheral angiopathy without gangrene: Secondary | ICD-10-CM | POA: Diagnosis not present

## 2015-09-06 DIAGNOSIS — E1165 Type 2 diabetes mellitus with hyperglycemia: Secondary | ICD-10-CM | POA: Diagnosis not present

## 2015-09-06 DIAGNOSIS — E1152 Type 2 diabetes mellitus with diabetic peripheral angiopathy with gangrene: Secondary | ICD-10-CM | POA: Diagnosis not present

## 2015-09-06 DIAGNOSIS — I70262 Atherosclerosis of native arteries of extremities with gangrene, left leg: Secondary | ICD-10-CM | POA: Diagnosis not present

## 2015-09-06 DIAGNOSIS — Z4781 Encounter for orthopedic aftercare following surgical amputation: Secondary | ICD-10-CM | POA: Diagnosis not present

## 2015-09-06 DIAGNOSIS — M6281 Muscle weakness (generalized): Secondary | ICD-10-CM | POA: Diagnosis not present

## 2015-09-06 DIAGNOSIS — M868X7 Other osteomyelitis, ankle and foot: Secondary | ICD-10-CM | POA: Diagnosis not present

## 2015-09-06 DIAGNOSIS — L03032 Cellulitis of left toe: Secondary | ICD-10-CM | POA: Diagnosis not present

## 2015-09-06 DIAGNOSIS — I10 Essential (primary) hypertension: Secondary | ICD-10-CM | POA: Diagnosis not present

## 2015-09-06 DIAGNOSIS — K219 Gastro-esophageal reflux disease without esophagitis: Secondary | ICD-10-CM | POA: Diagnosis not present

## 2015-09-06 DIAGNOSIS — D649 Anemia, unspecified: Secondary | ICD-10-CM | POA: Diagnosis not present

## 2015-09-06 DIAGNOSIS — Z89422 Acquired absence of other left toe(s): Secondary | ICD-10-CM | POA: Diagnosis not present

## 2015-09-06 DIAGNOSIS — R41841 Cognitive communication deficit: Secondary | ICD-10-CM | POA: Diagnosis not present

## 2015-09-06 DIAGNOSIS — R2689 Other abnormalities of gait and mobility: Secondary | ICD-10-CM | POA: Diagnosis not present

## 2015-09-07 DIAGNOSIS — I70262 Atherosclerosis of native arteries of extremities with gangrene, left leg: Secondary | ICD-10-CM | POA: Diagnosis not present

## 2015-09-07 DIAGNOSIS — D649 Anemia, unspecified: Secondary | ICD-10-CM | POA: Diagnosis not present

## 2015-09-23 DIAGNOSIS — E1165 Type 2 diabetes mellitus with hyperglycemia: Secondary | ICD-10-CM | POA: Diagnosis not present

## 2015-10-09 DIAGNOSIS — Z89422 Acquired absence of other left toe(s): Secondary | ICD-10-CM | POA: Diagnosis not present

## 2015-10-09 DIAGNOSIS — Z4781 Encounter for orthopedic aftercare following surgical amputation: Secondary | ICD-10-CM | POA: Diagnosis not present

## 2015-10-09 DIAGNOSIS — E1165 Type 2 diabetes mellitus with hyperglycemia: Secondary | ICD-10-CM | POA: Diagnosis not present

## 2015-10-09 DIAGNOSIS — Z8631 Personal history of diabetic foot ulcer: Secondary | ICD-10-CM | POA: Diagnosis not present

## 2015-10-09 DIAGNOSIS — E1151 Type 2 diabetes mellitus with diabetic peripheral angiopathy without gangrene: Secondary | ICD-10-CM | POA: Diagnosis not present

## 2015-10-10 DIAGNOSIS — K210 Gastro-esophageal reflux disease with esophagitis: Secondary | ICD-10-CM | POA: Diagnosis not present

## 2015-10-10 DIAGNOSIS — I10 Essential (primary) hypertension: Secondary | ICD-10-CM | POA: Diagnosis not present

## 2015-10-10 DIAGNOSIS — E1122 Type 2 diabetes mellitus with diabetic chronic kidney disease: Secondary | ICD-10-CM | POA: Diagnosis not present

## 2015-10-10 DIAGNOSIS — E7800 Pure hypercholesterolemia, unspecified: Secondary | ICD-10-CM | POA: Diagnosis not present

## 2015-10-11 DIAGNOSIS — M6281 Muscle weakness (generalized): Secondary | ICD-10-CM | POA: Diagnosis not present

## 2015-10-11 DIAGNOSIS — K219 Gastro-esophageal reflux disease without esophagitis: Secondary | ICD-10-CM | POA: Diagnosis not present

## 2015-10-11 DIAGNOSIS — D649 Anemia, unspecified: Secondary | ICD-10-CM | POA: Diagnosis not present

## 2015-10-11 DIAGNOSIS — E039 Hypothyroidism, unspecified: Secondary | ICD-10-CM | POA: Diagnosis not present

## 2015-10-11 DIAGNOSIS — F341 Dysthymic disorder: Secondary | ICD-10-CM | POA: Diagnosis not present

## 2015-10-11 DIAGNOSIS — E1151 Type 2 diabetes mellitus with diabetic peripheral angiopathy without gangrene: Secondary | ICD-10-CM | POA: Diagnosis not present

## 2015-10-11 DIAGNOSIS — Z89422 Acquired absence of other left toe(s): Secondary | ICD-10-CM | POA: Diagnosis not present

## 2015-10-11 DIAGNOSIS — Z8631 Personal history of diabetic foot ulcer: Secondary | ICD-10-CM | POA: Diagnosis not present

## 2015-10-11 DIAGNOSIS — Z4781 Encounter for orthopedic aftercare following surgical amputation: Secondary | ICD-10-CM | POA: Diagnosis not present

## 2015-10-12 DIAGNOSIS — M6281 Muscle weakness (generalized): Secondary | ICD-10-CM | POA: Diagnosis not present

## 2015-10-12 DIAGNOSIS — R2689 Other abnormalities of gait and mobility: Secondary | ICD-10-CM | POA: Diagnosis not present

## 2015-10-12 DIAGNOSIS — Z4781 Encounter for orthopedic aftercare following surgical amputation: Secondary | ICD-10-CM | POA: Diagnosis not present

## 2015-10-12 DIAGNOSIS — E1151 Type 2 diabetes mellitus with diabetic peripheral angiopathy without gangrene: Secondary | ICD-10-CM | POA: Diagnosis not present

## 2015-10-12 DIAGNOSIS — R41841 Cognitive communication deficit: Secondary | ICD-10-CM | POA: Diagnosis not present

## 2015-10-12 DIAGNOSIS — E039 Hypothyroidism, unspecified: Secondary | ICD-10-CM | POA: Diagnosis not present

## 2015-10-12 DIAGNOSIS — Z8631 Personal history of diabetic foot ulcer: Secondary | ICD-10-CM | POA: Diagnosis not present

## 2015-10-12 DIAGNOSIS — I10 Essential (primary) hypertension: Secondary | ICD-10-CM | POA: Diagnosis not present

## 2015-10-12 DIAGNOSIS — Z89422 Acquired absence of other left toe(s): Secondary | ICD-10-CM | POA: Diagnosis not present

## 2015-10-12 DIAGNOSIS — F341 Dysthymic disorder: Secondary | ICD-10-CM | POA: Diagnosis not present

## 2015-10-12 DIAGNOSIS — K219 Gastro-esophageal reflux disease without esophagitis: Secondary | ICD-10-CM | POA: Diagnosis not present

## 2015-10-12 DIAGNOSIS — D649 Anemia, unspecified: Secondary | ICD-10-CM | POA: Diagnosis not present

## 2015-10-15 DIAGNOSIS — Z89422 Acquired absence of other left toe(s): Secondary | ICD-10-CM | POA: Diagnosis not present

## 2015-10-15 DIAGNOSIS — Z4781 Encounter for orthopedic aftercare following surgical amputation: Secondary | ICD-10-CM | POA: Diagnosis not present

## 2015-10-15 DIAGNOSIS — D649 Anemia, unspecified: Secondary | ICD-10-CM | POA: Diagnosis not present

## 2015-10-15 DIAGNOSIS — Z8631 Personal history of diabetic foot ulcer: Secondary | ICD-10-CM | POA: Diagnosis not present

## 2015-10-15 DIAGNOSIS — M6281 Muscle weakness (generalized): Secondary | ICD-10-CM | POA: Diagnosis not present

## 2015-10-15 DIAGNOSIS — K219 Gastro-esophageal reflux disease without esophagitis: Secondary | ICD-10-CM | POA: Diagnosis not present

## 2015-10-15 DIAGNOSIS — E039 Hypothyroidism, unspecified: Secondary | ICD-10-CM | POA: Diagnosis not present

## 2015-10-15 DIAGNOSIS — F341 Dysthymic disorder: Secondary | ICD-10-CM | POA: Diagnosis not present

## 2015-10-15 DIAGNOSIS — E1151 Type 2 diabetes mellitus with diabetic peripheral angiopathy without gangrene: Secondary | ICD-10-CM | POA: Diagnosis not present

## 2015-10-16 DIAGNOSIS — E039 Hypothyroidism, unspecified: Secondary | ICD-10-CM | POA: Diagnosis not present

## 2015-10-16 DIAGNOSIS — E1151 Type 2 diabetes mellitus with diabetic peripheral angiopathy without gangrene: Secondary | ICD-10-CM | POA: Diagnosis not present

## 2015-10-16 DIAGNOSIS — D649 Anemia, unspecified: Secondary | ICD-10-CM | POA: Diagnosis not present

## 2015-10-16 DIAGNOSIS — K219 Gastro-esophageal reflux disease without esophagitis: Secondary | ICD-10-CM | POA: Diagnosis not present

## 2015-10-16 DIAGNOSIS — Z89422 Acquired absence of other left toe(s): Secondary | ICD-10-CM | POA: Diagnosis not present

## 2015-10-16 DIAGNOSIS — Z4781 Encounter for orthopedic aftercare following surgical amputation: Secondary | ICD-10-CM | POA: Diagnosis not present

## 2015-10-16 DIAGNOSIS — M6281 Muscle weakness (generalized): Secondary | ICD-10-CM | POA: Diagnosis not present

## 2015-10-16 DIAGNOSIS — Z8631 Personal history of diabetic foot ulcer: Secondary | ICD-10-CM | POA: Diagnosis not present

## 2015-10-16 DIAGNOSIS — F341 Dysthymic disorder: Secondary | ICD-10-CM | POA: Diagnosis not present

## 2015-10-17 DIAGNOSIS — E039 Hypothyroidism, unspecified: Secondary | ICD-10-CM | POA: Diagnosis not present

## 2015-10-17 DIAGNOSIS — Z89422 Acquired absence of other left toe(s): Secondary | ICD-10-CM | POA: Diagnosis not present

## 2015-10-17 DIAGNOSIS — D649 Anemia, unspecified: Secondary | ICD-10-CM | POA: Diagnosis not present

## 2015-10-17 DIAGNOSIS — M6281 Muscle weakness (generalized): Secondary | ICD-10-CM | POA: Diagnosis not present

## 2015-10-17 DIAGNOSIS — Z8631 Personal history of diabetic foot ulcer: Secondary | ICD-10-CM | POA: Diagnosis not present

## 2015-10-17 DIAGNOSIS — F341 Dysthymic disorder: Secondary | ICD-10-CM | POA: Diagnosis not present

## 2015-10-17 DIAGNOSIS — Z4781 Encounter for orthopedic aftercare following surgical amputation: Secondary | ICD-10-CM | POA: Diagnosis not present

## 2015-10-17 DIAGNOSIS — K219 Gastro-esophageal reflux disease without esophagitis: Secondary | ICD-10-CM | POA: Diagnosis not present

## 2015-10-17 DIAGNOSIS — E1151 Type 2 diabetes mellitus with diabetic peripheral angiopathy without gangrene: Secondary | ICD-10-CM | POA: Diagnosis not present

## 2015-10-18 DIAGNOSIS — E039 Hypothyroidism, unspecified: Secondary | ICD-10-CM | POA: Diagnosis not present

## 2015-10-18 DIAGNOSIS — D649 Anemia, unspecified: Secondary | ICD-10-CM | POA: Diagnosis not present

## 2015-10-18 DIAGNOSIS — Z4781 Encounter for orthopedic aftercare following surgical amputation: Secondary | ICD-10-CM | POA: Diagnosis not present

## 2015-10-18 DIAGNOSIS — E1151 Type 2 diabetes mellitus with diabetic peripheral angiopathy without gangrene: Secondary | ICD-10-CM | POA: Diagnosis not present

## 2015-10-18 DIAGNOSIS — F341 Dysthymic disorder: Secondary | ICD-10-CM | POA: Diagnosis not present

## 2015-10-18 DIAGNOSIS — M6281 Muscle weakness (generalized): Secondary | ICD-10-CM | POA: Diagnosis not present

## 2015-10-18 DIAGNOSIS — Z89422 Acquired absence of other left toe(s): Secondary | ICD-10-CM | POA: Diagnosis not present

## 2015-10-18 DIAGNOSIS — K219 Gastro-esophageal reflux disease without esophagitis: Secondary | ICD-10-CM | POA: Diagnosis not present

## 2015-10-18 DIAGNOSIS — Z8631 Personal history of diabetic foot ulcer: Secondary | ICD-10-CM | POA: Diagnosis not present

## 2015-10-19 DIAGNOSIS — Z89422 Acquired absence of other left toe(s): Secondary | ICD-10-CM | POA: Diagnosis not present

## 2015-10-19 DIAGNOSIS — Z4781 Encounter for orthopedic aftercare following surgical amputation: Secondary | ICD-10-CM | POA: Diagnosis not present

## 2015-10-19 DIAGNOSIS — E1151 Type 2 diabetes mellitus with diabetic peripheral angiopathy without gangrene: Secondary | ICD-10-CM | POA: Diagnosis not present

## 2015-10-19 DIAGNOSIS — D649 Anemia, unspecified: Secondary | ICD-10-CM | POA: Diagnosis not present

## 2015-10-19 DIAGNOSIS — E039 Hypothyroidism, unspecified: Secondary | ICD-10-CM | POA: Diagnosis not present

## 2015-10-19 DIAGNOSIS — K219 Gastro-esophageal reflux disease without esophagitis: Secondary | ICD-10-CM | POA: Diagnosis not present

## 2015-10-19 DIAGNOSIS — M6281 Muscle weakness (generalized): Secondary | ICD-10-CM | POA: Diagnosis not present

## 2015-10-19 DIAGNOSIS — F341 Dysthymic disorder: Secondary | ICD-10-CM | POA: Diagnosis not present

## 2015-10-19 DIAGNOSIS — Z8631 Personal history of diabetic foot ulcer: Secondary | ICD-10-CM | POA: Diagnosis not present

## 2015-10-22 DIAGNOSIS — Z8631 Personal history of diabetic foot ulcer: Secondary | ICD-10-CM | POA: Diagnosis not present

## 2015-10-22 DIAGNOSIS — K219 Gastro-esophageal reflux disease without esophagitis: Secondary | ICD-10-CM | POA: Diagnosis not present

## 2015-10-22 DIAGNOSIS — E039 Hypothyroidism, unspecified: Secondary | ICD-10-CM | POA: Diagnosis not present

## 2015-10-22 DIAGNOSIS — Z89422 Acquired absence of other left toe(s): Secondary | ICD-10-CM | POA: Diagnosis not present

## 2015-10-22 DIAGNOSIS — M6281 Muscle weakness (generalized): Secondary | ICD-10-CM | POA: Diagnosis not present

## 2015-10-22 DIAGNOSIS — Z4781 Encounter for orthopedic aftercare following surgical amputation: Secondary | ICD-10-CM | POA: Diagnosis not present

## 2015-10-22 DIAGNOSIS — F341 Dysthymic disorder: Secondary | ICD-10-CM | POA: Diagnosis not present

## 2015-10-22 DIAGNOSIS — E1151 Type 2 diabetes mellitus with diabetic peripheral angiopathy without gangrene: Secondary | ICD-10-CM | POA: Diagnosis not present

## 2015-10-22 DIAGNOSIS — D649 Anemia, unspecified: Secondary | ICD-10-CM | POA: Diagnosis not present

## 2015-10-23 DIAGNOSIS — Z8631 Personal history of diabetic foot ulcer: Secondary | ICD-10-CM | POA: Diagnosis not present

## 2015-10-23 DIAGNOSIS — Z4781 Encounter for orthopedic aftercare following surgical amputation: Secondary | ICD-10-CM | POA: Diagnosis not present

## 2015-10-23 DIAGNOSIS — E039 Hypothyroidism, unspecified: Secondary | ICD-10-CM | POA: Diagnosis not present

## 2015-10-23 DIAGNOSIS — E1151 Type 2 diabetes mellitus with diabetic peripheral angiopathy without gangrene: Secondary | ICD-10-CM | POA: Diagnosis not present

## 2015-10-23 DIAGNOSIS — F341 Dysthymic disorder: Secondary | ICD-10-CM | POA: Diagnosis not present

## 2015-10-23 DIAGNOSIS — K219 Gastro-esophageal reflux disease without esophagitis: Secondary | ICD-10-CM | POA: Diagnosis not present

## 2015-10-23 DIAGNOSIS — M6281 Muscle weakness (generalized): Secondary | ICD-10-CM | POA: Diagnosis not present

## 2015-10-23 DIAGNOSIS — D649 Anemia, unspecified: Secondary | ICD-10-CM | POA: Diagnosis not present

## 2015-10-23 DIAGNOSIS — Z89422 Acquired absence of other left toe(s): Secondary | ICD-10-CM | POA: Diagnosis not present

## 2015-10-24 DIAGNOSIS — Z8631 Personal history of diabetic foot ulcer: Secondary | ICD-10-CM | POA: Diagnosis not present

## 2015-10-24 DIAGNOSIS — F341 Dysthymic disorder: Secondary | ICD-10-CM | POA: Diagnosis not present

## 2015-10-24 DIAGNOSIS — M6281 Muscle weakness (generalized): Secondary | ICD-10-CM | POA: Diagnosis not present

## 2015-10-24 DIAGNOSIS — E1151 Type 2 diabetes mellitus with diabetic peripheral angiopathy without gangrene: Secondary | ICD-10-CM | POA: Diagnosis not present

## 2015-10-24 DIAGNOSIS — D649 Anemia, unspecified: Secondary | ICD-10-CM | POA: Diagnosis not present

## 2015-10-24 DIAGNOSIS — E039 Hypothyroidism, unspecified: Secondary | ICD-10-CM | POA: Diagnosis not present

## 2015-10-24 DIAGNOSIS — Z89422 Acquired absence of other left toe(s): Secondary | ICD-10-CM | POA: Diagnosis not present

## 2015-10-24 DIAGNOSIS — K219 Gastro-esophageal reflux disease without esophagitis: Secondary | ICD-10-CM | POA: Diagnosis not present

## 2015-10-24 DIAGNOSIS — Z4781 Encounter for orthopedic aftercare following surgical amputation: Secondary | ICD-10-CM | POA: Diagnosis not present

## 2015-10-25 DIAGNOSIS — F341 Dysthymic disorder: Secondary | ICD-10-CM | POA: Diagnosis not present

## 2015-10-25 DIAGNOSIS — D649 Anemia, unspecified: Secondary | ICD-10-CM | POA: Diagnosis not present

## 2015-10-25 DIAGNOSIS — Z4781 Encounter for orthopedic aftercare following surgical amputation: Secondary | ICD-10-CM | POA: Diagnosis not present

## 2015-10-25 DIAGNOSIS — E1151 Type 2 diabetes mellitus with diabetic peripheral angiopathy without gangrene: Secondary | ICD-10-CM | POA: Diagnosis not present

## 2015-10-25 DIAGNOSIS — Z8631 Personal history of diabetic foot ulcer: Secondary | ICD-10-CM | POA: Diagnosis not present

## 2015-10-25 DIAGNOSIS — Z89422 Acquired absence of other left toe(s): Secondary | ICD-10-CM | POA: Diagnosis not present

## 2015-10-25 DIAGNOSIS — K219 Gastro-esophageal reflux disease without esophagitis: Secondary | ICD-10-CM | POA: Diagnosis not present

## 2015-10-25 DIAGNOSIS — E039 Hypothyroidism, unspecified: Secondary | ICD-10-CM | POA: Diagnosis not present

## 2015-10-25 DIAGNOSIS — M6281 Muscle weakness (generalized): Secondary | ICD-10-CM | POA: Diagnosis not present

## 2015-10-26 DIAGNOSIS — E1151 Type 2 diabetes mellitus with diabetic peripheral angiopathy without gangrene: Secondary | ICD-10-CM | POA: Diagnosis not present

## 2015-10-26 DIAGNOSIS — Z8631 Personal history of diabetic foot ulcer: Secondary | ICD-10-CM | POA: Diagnosis not present

## 2015-10-26 DIAGNOSIS — F341 Dysthymic disorder: Secondary | ICD-10-CM | POA: Diagnosis not present

## 2015-10-26 DIAGNOSIS — Z4781 Encounter for orthopedic aftercare following surgical amputation: Secondary | ICD-10-CM | POA: Diagnosis not present

## 2015-10-26 DIAGNOSIS — M6281 Muscle weakness (generalized): Secondary | ICD-10-CM | POA: Diagnosis not present

## 2015-10-26 DIAGNOSIS — D649 Anemia, unspecified: Secondary | ICD-10-CM | POA: Diagnosis not present

## 2015-10-26 DIAGNOSIS — Z89422 Acquired absence of other left toe(s): Secondary | ICD-10-CM | POA: Diagnosis not present

## 2015-10-26 DIAGNOSIS — K219 Gastro-esophageal reflux disease without esophagitis: Secondary | ICD-10-CM | POA: Diagnosis not present

## 2015-10-26 DIAGNOSIS — E039 Hypothyroidism, unspecified: Secondary | ICD-10-CM | POA: Diagnosis not present

## 2015-10-29 DIAGNOSIS — E039 Hypothyroidism, unspecified: Secondary | ICD-10-CM | POA: Diagnosis not present

## 2015-10-29 DIAGNOSIS — Z4781 Encounter for orthopedic aftercare following surgical amputation: Secondary | ICD-10-CM | POA: Diagnosis not present

## 2015-10-29 DIAGNOSIS — E1151 Type 2 diabetes mellitus with diabetic peripheral angiopathy without gangrene: Secondary | ICD-10-CM | POA: Diagnosis not present

## 2015-10-29 DIAGNOSIS — Z8631 Personal history of diabetic foot ulcer: Secondary | ICD-10-CM | POA: Diagnosis not present

## 2015-10-29 DIAGNOSIS — Z89422 Acquired absence of other left toe(s): Secondary | ICD-10-CM | POA: Diagnosis not present

## 2015-10-29 DIAGNOSIS — D649 Anemia, unspecified: Secondary | ICD-10-CM | POA: Diagnosis not present

## 2015-10-29 DIAGNOSIS — F341 Dysthymic disorder: Secondary | ICD-10-CM | POA: Diagnosis not present

## 2015-10-29 DIAGNOSIS — M6281 Muscle weakness (generalized): Secondary | ICD-10-CM | POA: Diagnosis not present

## 2015-10-29 DIAGNOSIS — K219 Gastro-esophageal reflux disease without esophagitis: Secondary | ICD-10-CM | POA: Diagnosis not present

## 2015-10-30 DIAGNOSIS — F341 Dysthymic disorder: Secondary | ICD-10-CM | POA: Diagnosis not present

## 2015-10-30 DIAGNOSIS — D649 Anemia, unspecified: Secondary | ICD-10-CM | POA: Diagnosis not present

## 2015-10-30 DIAGNOSIS — Z89422 Acquired absence of other left toe(s): Secondary | ICD-10-CM | POA: Diagnosis not present

## 2015-10-30 DIAGNOSIS — Z8631 Personal history of diabetic foot ulcer: Secondary | ICD-10-CM | POA: Diagnosis not present

## 2015-10-30 DIAGNOSIS — E1151 Type 2 diabetes mellitus with diabetic peripheral angiopathy without gangrene: Secondary | ICD-10-CM | POA: Diagnosis not present

## 2015-10-30 DIAGNOSIS — K219 Gastro-esophageal reflux disease without esophagitis: Secondary | ICD-10-CM | POA: Diagnosis not present

## 2015-10-30 DIAGNOSIS — M6281 Muscle weakness (generalized): Secondary | ICD-10-CM | POA: Diagnosis not present

## 2015-10-30 DIAGNOSIS — E039 Hypothyroidism, unspecified: Secondary | ICD-10-CM | POA: Diagnosis not present

## 2015-10-30 DIAGNOSIS — Z4781 Encounter for orthopedic aftercare following surgical amputation: Secondary | ICD-10-CM | POA: Diagnosis not present

## 2015-10-31 DIAGNOSIS — D649 Anemia, unspecified: Secondary | ICD-10-CM | POA: Diagnosis not present

## 2015-10-31 DIAGNOSIS — Z8631 Personal history of diabetic foot ulcer: Secondary | ICD-10-CM | POA: Diagnosis not present

## 2015-10-31 DIAGNOSIS — Z4781 Encounter for orthopedic aftercare following surgical amputation: Secondary | ICD-10-CM | POA: Diagnosis not present

## 2015-10-31 DIAGNOSIS — Z89422 Acquired absence of other left toe(s): Secondary | ICD-10-CM | POA: Diagnosis not present

## 2015-10-31 DIAGNOSIS — K219 Gastro-esophageal reflux disease without esophagitis: Secondary | ICD-10-CM | POA: Diagnosis not present

## 2015-10-31 DIAGNOSIS — F341 Dysthymic disorder: Secondary | ICD-10-CM | POA: Diagnosis not present

## 2015-10-31 DIAGNOSIS — M6281 Muscle weakness (generalized): Secondary | ICD-10-CM | POA: Diagnosis not present

## 2015-10-31 DIAGNOSIS — E039 Hypothyroidism, unspecified: Secondary | ICD-10-CM | POA: Diagnosis not present

## 2015-10-31 DIAGNOSIS — E1151 Type 2 diabetes mellitus with diabetic peripheral angiopathy without gangrene: Secondary | ICD-10-CM | POA: Diagnosis not present

## 2015-11-01 DIAGNOSIS — K219 Gastro-esophageal reflux disease without esophagitis: Secondary | ICD-10-CM | POA: Diagnosis not present

## 2015-11-01 DIAGNOSIS — E1151 Type 2 diabetes mellitus with diabetic peripheral angiopathy without gangrene: Secondary | ICD-10-CM | POA: Diagnosis not present

## 2015-11-01 DIAGNOSIS — D649 Anemia, unspecified: Secondary | ICD-10-CM | POA: Diagnosis not present

## 2015-11-01 DIAGNOSIS — Z4781 Encounter for orthopedic aftercare following surgical amputation: Secondary | ICD-10-CM | POA: Diagnosis not present

## 2015-11-01 DIAGNOSIS — Z89422 Acquired absence of other left toe(s): Secondary | ICD-10-CM | POA: Diagnosis not present

## 2015-11-01 DIAGNOSIS — Z8631 Personal history of diabetic foot ulcer: Secondary | ICD-10-CM | POA: Diagnosis not present

## 2015-11-01 DIAGNOSIS — E039 Hypothyroidism, unspecified: Secondary | ICD-10-CM | POA: Diagnosis not present

## 2015-11-01 DIAGNOSIS — M6281 Muscle weakness (generalized): Secondary | ICD-10-CM | POA: Diagnosis not present

## 2015-11-01 DIAGNOSIS — F341 Dysthymic disorder: Secondary | ICD-10-CM | POA: Diagnosis not present

## 2015-11-06 DIAGNOSIS — E1151 Type 2 diabetes mellitus with diabetic peripheral angiopathy without gangrene: Secondary | ICD-10-CM | POA: Diagnosis not present

## 2015-11-06 DIAGNOSIS — M6281 Muscle weakness (generalized): Secondary | ICD-10-CM | POA: Diagnosis not present

## 2015-11-06 DIAGNOSIS — D649 Anemia, unspecified: Secondary | ICD-10-CM | POA: Diagnosis not present

## 2015-11-06 DIAGNOSIS — E039 Hypothyroidism, unspecified: Secondary | ICD-10-CM | POA: Diagnosis not present

## 2015-11-06 DIAGNOSIS — Z89422 Acquired absence of other left toe(s): Secondary | ICD-10-CM | POA: Diagnosis not present

## 2015-11-06 DIAGNOSIS — F341 Dysthymic disorder: Secondary | ICD-10-CM | POA: Diagnosis not present

## 2015-11-06 DIAGNOSIS — Z4781 Encounter for orthopedic aftercare following surgical amputation: Secondary | ICD-10-CM | POA: Diagnosis not present

## 2015-11-06 DIAGNOSIS — K219 Gastro-esophageal reflux disease without esophagitis: Secondary | ICD-10-CM | POA: Diagnosis not present

## 2015-11-06 DIAGNOSIS — Z8631 Personal history of diabetic foot ulcer: Secondary | ICD-10-CM | POA: Diagnosis not present

## 2015-11-07 DIAGNOSIS — Z89422 Acquired absence of other left toe(s): Secondary | ICD-10-CM | POA: Diagnosis not present

## 2015-11-07 DIAGNOSIS — Z4781 Encounter for orthopedic aftercare following surgical amputation: Secondary | ICD-10-CM | POA: Diagnosis not present

## 2015-11-07 DIAGNOSIS — E039 Hypothyroidism, unspecified: Secondary | ICD-10-CM | POA: Diagnosis not present

## 2015-11-07 DIAGNOSIS — Z8631 Personal history of diabetic foot ulcer: Secondary | ICD-10-CM | POA: Diagnosis not present

## 2015-11-07 DIAGNOSIS — F341 Dysthymic disorder: Secondary | ICD-10-CM | POA: Diagnosis not present

## 2015-11-07 DIAGNOSIS — E1151 Type 2 diabetes mellitus with diabetic peripheral angiopathy without gangrene: Secondary | ICD-10-CM | POA: Diagnosis not present

## 2015-11-07 DIAGNOSIS — K219 Gastro-esophageal reflux disease without esophagitis: Secondary | ICD-10-CM | POA: Diagnosis not present

## 2015-11-07 DIAGNOSIS — M6281 Muscle weakness (generalized): Secondary | ICD-10-CM | POA: Diagnosis not present

## 2015-11-07 DIAGNOSIS — D649 Anemia, unspecified: Secondary | ICD-10-CM | POA: Diagnosis not present

## 2015-11-08 DIAGNOSIS — R809 Proteinuria, unspecified: Secondary | ICD-10-CM | POA: Diagnosis not present

## 2015-11-08 DIAGNOSIS — I96 Gangrene, not elsewhere classified: Secondary | ICD-10-CM | POA: Diagnosis not present

## 2015-11-08 DIAGNOSIS — D631 Anemia in chronic kidney disease: Secondary | ICD-10-CM | POA: Diagnosis not present

## 2015-11-08 DIAGNOSIS — E1122 Type 2 diabetes mellitus with diabetic chronic kidney disease: Secondary | ICD-10-CM | POA: Diagnosis not present

## 2015-11-08 DIAGNOSIS — N183 Chronic kidney disease, stage 3 (moderate): Secondary | ICD-10-CM | POA: Diagnosis not present

## 2015-11-08 DIAGNOSIS — I10 Essential (primary) hypertension: Secondary | ICD-10-CM | POA: Diagnosis not present

## 2015-11-08 DIAGNOSIS — E1165 Type 2 diabetes mellitus with hyperglycemia: Secondary | ICD-10-CM | POA: Diagnosis not present

## 2015-11-08 DIAGNOSIS — E782 Mixed hyperlipidemia: Secondary | ICD-10-CM | POA: Diagnosis not present

## 2015-11-09 DIAGNOSIS — Z89422 Acquired absence of other left toe(s): Secondary | ICD-10-CM | POA: Diagnosis not present

## 2015-11-09 DIAGNOSIS — E039 Hypothyroidism, unspecified: Secondary | ICD-10-CM | POA: Diagnosis not present

## 2015-11-09 DIAGNOSIS — D649 Anemia, unspecified: Secondary | ICD-10-CM | POA: Diagnosis not present

## 2015-11-09 DIAGNOSIS — Z4781 Encounter for orthopedic aftercare following surgical amputation: Secondary | ICD-10-CM | POA: Diagnosis not present

## 2015-11-09 DIAGNOSIS — K219 Gastro-esophageal reflux disease without esophagitis: Secondary | ICD-10-CM | POA: Diagnosis not present

## 2015-11-09 DIAGNOSIS — M6281 Muscle weakness (generalized): Secondary | ICD-10-CM | POA: Diagnosis not present

## 2015-11-09 DIAGNOSIS — Z8631 Personal history of diabetic foot ulcer: Secondary | ICD-10-CM | POA: Diagnosis not present

## 2015-11-09 DIAGNOSIS — E1151 Type 2 diabetes mellitus with diabetic peripheral angiopathy without gangrene: Secondary | ICD-10-CM | POA: Diagnosis not present

## 2015-11-09 DIAGNOSIS — F341 Dysthymic disorder: Secondary | ICD-10-CM | POA: Diagnosis not present

## 2015-11-12 DIAGNOSIS — D649 Anemia, unspecified: Secondary | ICD-10-CM | POA: Diagnosis not present

## 2015-11-12 DIAGNOSIS — E039 Hypothyroidism, unspecified: Secondary | ICD-10-CM | POA: Diagnosis not present

## 2015-11-12 DIAGNOSIS — K219 Gastro-esophageal reflux disease without esophagitis: Secondary | ICD-10-CM | POA: Diagnosis not present

## 2015-11-12 DIAGNOSIS — Z89422 Acquired absence of other left toe(s): Secondary | ICD-10-CM | POA: Diagnosis not present

## 2015-11-12 DIAGNOSIS — M6281 Muscle weakness (generalized): Secondary | ICD-10-CM | POA: Diagnosis not present

## 2015-11-12 DIAGNOSIS — Z8631 Personal history of diabetic foot ulcer: Secondary | ICD-10-CM | POA: Diagnosis not present

## 2015-11-12 DIAGNOSIS — Z4781 Encounter for orthopedic aftercare following surgical amputation: Secondary | ICD-10-CM | POA: Diagnosis not present

## 2015-11-12 DIAGNOSIS — R2689 Other abnormalities of gait and mobility: Secondary | ICD-10-CM | POA: Diagnosis not present

## 2015-11-12 DIAGNOSIS — F341 Dysthymic disorder: Secondary | ICD-10-CM | POA: Diagnosis not present

## 2015-11-12 DIAGNOSIS — R41841 Cognitive communication deficit: Secondary | ICD-10-CM | POA: Diagnosis not present

## 2015-11-12 DIAGNOSIS — I10 Essential (primary) hypertension: Secondary | ICD-10-CM | POA: Diagnosis not present

## 2015-11-12 DIAGNOSIS — E1151 Type 2 diabetes mellitus with diabetic peripheral angiopathy without gangrene: Secondary | ICD-10-CM | POA: Diagnosis not present

## 2015-11-13 ENCOUNTER — Encounter: Payer: Self-pay | Admitting: Surgery

## 2015-11-19 ENCOUNTER — Encounter: Payer: Self-pay | Admitting: Surgery

## 2015-11-19 ENCOUNTER — Ambulatory Visit (INDEPENDENT_AMBULATORY_CARE_PROVIDER_SITE_OTHER): Payer: Medicare PPO | Admitting: Surgery

## 2015-11-19 ENCOUNTER — Ambulatory Visit (HOSPITAL_COMMUNITY)
Admission: RE | Admit: 2015-11-19 | Discharge: 2015-11-19 | Disposition: A | Discharge status: Home / Self Care | Payer: Medicare PPO | Source: Ambulatory Visit | Attending: Surgery | Admitting: Surgery

## 2015-11-19 VITALS — BP 84/53 | HR 76 | Temp 97.6°F | Resp 12 | Ht 64.0 in | Wt 157.0 lb

## 2015-11-19 DIAGNOSIS — I739 Peripheral vascular disease, unspecified: Secondary | ICD-10-CM | POA: Diagnosis not present

## 2015-11-19 DIAGNOSIS — I10 Essential (primary) hypertension: Secondary | ICD-10-CM | POA: Diagnosis not present

## 2015-11-19 DIAGNOSIS — E119 Type 2 diabetes mellitus without complications: Secondary | ICD-10-CM | POA: Diagnosis not present

## 2015-11-19 DIAGNOSIS — Z9862 Peripheral vascular angioplasty status: Secondary | ICD-10-CM | POA: Diagnosis not present

## 2015-11-19 DIAGNOSIS — I771 Stricture of artery: Secondary | ICD-10-CM | POA: Diagnosis not present

## 2015-11-19 DIAGNOSIS — R0989 Other specified symptoms and signs involving the circulatory and respiratory systems: Secondary | ICD-10-CM | POA: Diagnosis present

## 2015-11-19 DIAGNOSIS — I96 Gangrene, not elsewhere classified: Secondary | ICD-10-CM

## 2015-11-19 NOTE — Progress Notes (Signed)
[facility]  Patient name: Sonya Barnett MRN: [id number] DOB: 08/14/1923 Sex: female  REASON FOR VISIT: Follow-up toe ulcer  HPI: Sonya Barnett is a 82 y.o. female who I saw as a referral from Dr. Ulice Brilliantrake on 07/02/2015.Marland Kitchen.  She had a nonhealing wound on her left second toe.  It had gotten progressively worse.  Ultrasound showed severe tibial vessel occlusion.  In an attempt to prevent a proximal amputation she was taken for angiography on 07/17/2015.  She underwent angioplasty of the left popliteal and left anterior tibial artery for tibial vessel occlusion.  In the interim she has undergone a toe amputation.  She states that her only medical problem is diabetes, which she takes Metformin and Glipizide. She is on a beta blocker and ACEI for blood pressure control. She takes a daily aspirin.    Past Medical History  Diagnosis Date  . Diabetes mellitus without complication (HCC)   . Hypertension   . Thyroid disease     No family history on file.  SOCIAL HISTORY: Social History  Substance Use Topics  . Smoking status: Never Smoker   . Smokeless tobacco: Never Used  . Alcohol Use: No    Allergies  Allergen Reactions  . Nsaids     " I feel out of it and have to go to the hospital"    Current Outpatient Prescriptions  Medication Sig Dispense Refill  . aspirin EC 81 MG tablet Take 81 mg by mouth daily.    Marland Kitchen. doxycycline (VIBRA-TABS) 100 MG tablet Take 100 mg by mouth every 12 (twelve) hours.    Marland Kitchen. glipiZIDE (GLUCOTROL XL) 5 MG 24 hr tablet Take 5 mg by mouth daily with breakfast.     . levofloxacin (LEVAQUIN) 500 MG tablet Take 1 tablet (500 mg total) by mouth every other day. 10 tablet 0  . levothyroxine (SYNTHROID, LEVOTHROID) 100 MCG tablet Take 100 mcg by mouth daily before breakfast.     . lisinopril-hydrochlorothiazide (PRINZIDE,ZESTORETIC) 20-12.5 MG tablet Take 1 tablet by mouth daily.     . metFORMIN (GLUCOPHAGE-XR) 500 MG 24 hr tablet Take  500 mg by mouth 2 (two) times daily.     . metoprolol tartrate (LOPRESSOR) 25 MG tablet Take 25 mg by mouth 2 (two) times daily.     . sertraline (ZOLOFT) 50 MG tablet Take 75 mg by mouth daily.      No current facility-administered medications for this visit.    REVIEW OF SYSTEMS:  [X]  denotes positive finding, [ ]  denotes negative finding Cardiac  Comments:  Chest pain or chest pressure:    Shortness of breath upon exertion:    Short of breath when lying flat:    Irregular heart rhythm:        Vascular    Pain in calf, thigh, or hip brought on by ambulation:    Pain in feet at night that wakes you up from your sleep:     Blood clot in your veins:    Leg swelling:         Pulmonary    Oxygen at home:    Productive cough:     Wheezing:         Neurologic    Sudden weakness in arms or legs:     Sudden numbness in arms or legs:     Sudden onset of difficulty speaking or slurred speech:    Temporary loss of vision in one eye:     Problems with  dizziness:         Gastrointestinal    Blood in stool:     Vomited blood:         Genitourinary    Burning when urinating:     Blood in urine:        Psychiatric    Major depression:         Hematologic    Bleeding problems:    Problems with blood clotting too easily:        Skin    Rashes or ulcers: x Nearly healed left second toe amputation dry eschar to left great toe       Constitutional    Fever or chills:      PHYSICAL EXAM: There were no vitals filed for this visit.  GENERAL: The patient is a well-nourished female, in no acute distress. The vital signs are documented above. CARDIAC: There is a regular rate and rhythm.  PULMONARY: There is good air exchange bilaterally without wheezing or rales. ABDOMEN: Soft and non-tender with normal pitched bowel sounds.  MUSCULOSKELETAL: There are no major deformities or cyanosis. NEUROLOGIC: No focal weakness or paresthesias are detected. SKIN: 1 mm residual opening from  left second toe amputation.  New dry gangrene to the tip of the left great toe without any erythema. PSYCHIATRIC: The patient has a normal affect.  DATA:  Duplex of her tibial intervention today shows that it has occluded.  MEDICAL ISSUES: The patient seems to be stable right now.  She has a new dry eschar on the tip of her left great toe and the left second toe amputation is nearly healed.  Unfortunately, the tibial intervention has reoccluded.  I am reluctant to offer her an additional procedure at this time.  I have recommended that we continue with wound care to see if the second toe amputation site will go on to heal.  If she needs to have anything done for the left great toe, I would consider repeat revascularization, as I do not think she would be able to heal this without improved blood flow.  She will contact me if her wounds deteriorate.    Durene Cal [facility] [employer]: [phone]

## 2015-11-20 DIAGNOSIS — F341 Dysthymic disorder: Secondary | ICD-10-CM | POA: Diagnosis not present

## 2015-11-20 DIAGNOSIS — E039 Hypothyroidism, unspecified: Secondary | ICD-10-CM | POA: Diagnosis not present

## 2015-11-20 DIAGNOSIS — E1151 Type 2 diabetes mellitus with diabetic peripheral angiopathy without gangrene: Secondary | ICD-10-CM | POA: Diagnosis not present

## 2015-11-20 DIAGNOSIS — M6281 Muscle weakness (generalized): Secondary | ICD-10-CM | POA: Diagnosis not present

## 2015-11-20 DIAGNOSIS — Z89422 Acquired absence of other left toe(s): Secondary | ICD-10-CM | POA: Diagnosis not present

## 2015-11-20 DIAGNOSIS — D649 Anemia, unspecified: Secondary | ICD-10-CM | POA: Diagnosis not present

## 2015-11-20 DIAGNOSIS — Z8631 Personal history of diabetic foot ulcer: Secondary | ICD-10-CM | POA: Diagnosis not present

## 2015-11-20 DIAGNOSIS — Z4781 Encounter for orthopedic aftercare following surgical amputation: Secondary | ICD-10-CM | POA: Diagnosis not present

## 2015-11-20 DIAGNOSIS — K219 Gastro-esophageal reflux disease without esophagitis: Secondary | ICD-10-CM | POA: Diagnosis not present

## 2015-11-27 DIAGNOSIS — L089 Local infection of the skin and subcutaneous tissue, unspecified: Secondary | ICD-10-CM | POA: Diagnosis not present

## 2015-11-28 DIAGNOSIS — K219 Gastro-esophageal reflux disease without esophagitis: Secondary | ICD-10-CM | POA: Diagnosis not present

## 2015-11-28 DIAGNOSIS — M6281 Muscle weakness (generalized): Secondary | ICD-10-CM | POA: Diagnosis not present

## 2015-11-28 DIAGNOSIS — D649 Anemia, unspecified: Secondary | ICD-10-CM | POA: Diagnosis not present

## 2015-11-28 DIAGNOSIS — Z4781 Encounter for orthopedic aftercare following surgical amputation: Secondary | ICD-10-CM | POA: Diagnosis not present

## 2015-11-28 DIAGNOSIS — Z89422 Acquired absence of other left toe(s): Secondary | ICD-10-CM | POA: Diagnosis not present

## 2015-11-28 DIAGNOSIS — E1151 Type 2 diabetes mellitus with diabetic peripheral angiopathy without gangrene: Secondary | ICD-10-CM | POA: Diagnosis not present

## 2015-11-28 DIAGNOSIS — E039 Hypothyroidism, unspecified: Secondary | ICD-10-CM | POA: Diagnosis not present

## 2015-11-28 DIAGNOSIS — Z8631 Personal history of diabetic foot ulcer: Secondary | ICD-10-CM | POA: Diagnosis not present

## 2015-11-28 DIAGNOSIS — F341 Dysthymic disorder: Secondary | ICD-10-CM | POA: Diagnosis not present

## 2015-11-30 ENCOUNTER — Telehealth: Payer: Self-pay

## 2015-11-30 NOTE — Telephone Encounter (Signed)
Attempted to contact pt. To discuss scheduling angiogram, as recommended by Dr. Myra GianottiBrabham.  Left voice message to call the office.

## 2015-11-30 NOTE — Telephone Encounter (Signed)
-----   Message from Nada LibmanVance W Brabham, MD sent at 11/28/2015 11:03 PM EDT ----- Regarding: RE: needs recommendation Schedule for angio if she is agreeable ----- Message -----    From: Phillips Odorarol S Maureena Dabbs, RN    Sent: 11/27/2015   3:34 PM      To: Nada LibmanVance W Brabham, MD Subject: needs recommendation                           you saw this pt. on 11/19/15, and indicated she would have to have another attempt at revascularization, if her wound deteriorated.  Dr. Morrell Riddleaffey's office called today, and said he would like her reevaluated, due to an infection on the medial aspect of left foot.  He took a wound culture and started her on an antibiotic.  Do you want to see her back in the office, or schedule for an Aortogram?

## 2015-12-05 ENCOUNTER — Other Ambulatory Visit: Payer: Self-pay | Admitting: *Deleted

## 2015-12-05 DIAGNOSIS — F341 Dysthymic disorder: Secondary | ICD-10-CM | POA: Diagnosis not present

## 2015-12-05 DIAGNOSIS — K219 Gastro-esophageal reflux disease without esophagitis: Secondary | ICD-10-CM | POA: Diagnosis not present

## 2015-12-05 DIAGNOSIS — E039 Hypothyroidism, unspecified: Secondary | ICD-10-CM | POA: Diagnosis not present

## 2015-12-05 DIAGNOSIS — Z89422 Acquired absence of other left toe(s): Secondary | ICD-10-CM | POA: Diagnosis not present

## 2015-12-05 DIAGNOSIS — D649 Anemia, unspecified: Secondary | ICD-10-CM | POA: Diagnosis not present

## 2015-12-05 DIAGNOSIS — M6281 Muscle weakness (generalized): Secondary | ICD-10-CM | POA: Diagnosis not present

## 2015-12-05 DIAGNOSIS — Z4781 Encounter for orthopedic aftercare following surgical amputation: Secondary | ICD-10-CM | POA: Diagnosis not present

## 2015-12-05 DIAGNOSIS — E1151 Type 2 diabetes mellitus with diabetic peripheral angiopathy without gangrene: Secondary | ICD-10-CM | POA: Diagnosis not present

## 2015-12-05 DIAGNOSIS — Z8631 Personal history of diabetic foot ulcer: Secondary | ICD-10-CM | POA: Diagnosis not present

## 2015-12-05 NOTE — Telephone Encounter (Addendum)
I have tried to contact Sonya Barnett's family and finally I spoke to her daughter, Sonya Barnett, today. She said that her brother Sonya Barnett was POA and that she would tell him to call us about scheduling this angiogram. According to Sonya Barnett, the family is hesitant about scheduling it because "Sonya Barnett told them that it would be very risky and that she might not survive it". I asked to speak to the son but he was not at home. Left our contact information and will try to explain everything to patient's son Sonya Barnett when he calls us back. Patient has not seen Sonya Barnett recently per daughter.

## 2015-12-10 DIAGNOSIS — Z8631 Personal history of diabetic foot ulcer: Secondary | ICD-10-CM | POA: Diagnosis not present

## 2015-12-10 DIAGNOSIS — K219 Gastro-esophageal reflux disease without esophagitis: Secondary | ICD-10-CM | POA: Diagnosis not present

## 2015-12-10 DIAGNOSIS — Z89422 Acquired absence of other left toe(s): Secondary | ICD-10-CM | POA: Diagnosis not present

## 2015-12-10 DIAGNOSIS — D649 Anemia, unspecified: Secondary | ICD-10-CM | POA: Diagnosis not present

## 2015-12-10 DIAGNOSIS — L97529 Non-pressure chronic ulcer of other part of left foot with unspecified severity: Secondary | ICD-10-CM | POA: Diagnosis not present

## 2015-12-10 DIAGNOSIS — E11621 Type 2 diabetes mellitus with foot ulcer: Secondary | ICD-10-CM | POA: Diagnosis not present

## 2015-12-10 DIAGNOSIS — E1151 Type 2 diabetes mellitus with diabetic peripheral angiopathy without gangrene: Secondary | ICD-10-CM | POA: Diagnosis not present

## 2015-12-10 DIAGNOSIS — M6281 Muscle weakness (generalized): Secondary | ICD-10-CM | POA: Diagnosis not present

## 2015-12-10 DIAGNOSIS — L97519 Non-pressure chronic ulcer of other part of right foot with unspecified severity: Secondary | ICD-10-CM | POA: Diagnosis not present

## 2015-12-12 DIAGNOSIS — E1151 Type 2 diabetes mellitus with diabetic peripheral angiopathy without gangrene: Secondary | ICD-10-CM | POA: Diagnosis not present

## 2015-12-12 DIAGNOSIS — I10 Essential (primary) hypertension: Secondary | ICD-10-CM | POA: Diagnosis not present

## 2015-12-12 DIAGNOSIS — R41841 Cognitive communication deficit: Secondary | ICD-10-CM | POA: Diagnosis not present

## 2015-12-12 DIAGNOSIS — R2689 Other abnormalities of gait and mobility: Secondary | ICD-10-CM | POA: Diagnosis not present

## 2015-12-12 DIAGNOSIS — Z89422 Acquired absence of other left toe(s): Secondary | ICD-10-CM | POA: Diagnosis not present

## 2015-12-12 DIAGNOSIS — M6281 Muscle weakness (generalized): Secondary | ICD-10-CM | POA: Diagnosis not present

## 2015-12-12 DIAGNOSIS — K219 Gastro-esophageal reflux disease without esophagitis: Secondary | ICD-10-CM | POA: Diagnosis not present

## 2015-12-14 NOTE — Telephone Encounter (Signed)
Phone call placed to Mrs. Kovalcik's home re: scheduling angiogram.  Spoke with daughter, Steward DroneBrenda.  Stated her brother would need to make the decision about scheduling the angiogram, but that he is not available until Tuesday, as he is away.  Stated she will discuss this with him, and ask him to call our office Tuesday.

## 2015-12-17 DIAGNOSIS — K219 Gastro-esophageal reflux disease without esophagitis: Secondary | ICD-10-CM | POA: Diagnosis not present

## 2015-12-17 DIAGNOSIS — L97519 Non-pressure chronic ulcer of other part of right foot with unspecified severity: Secondary | ICD-10-CM | POA: Diagnosis not present

## 2015-12-17 DIAGNOSIS — Z89422 Acquired absence of other left toe(s): Secondary | ICD-10-CM | POA: Diagnosis not present

## 2015-12-17 DIAGNOSIS — Z8631 Personal history of diabetic foot ulcer: Secondary | ICD-10-CM | POA: Diagnosis not present

## 2015-12-17 DIAGNOSIS — E1151 Type 2 diabetes mellitus with diabetic peripheral angiopathy without gangrene: Secondary | ICD-10-CM | POA: Diagnosis not present

## 2015-12-17 DIAGNOSIS — D649 Anemia, unspecified: Secondary | ICD-10-CM | POA: Diagnosis not present

## 2015-12-17 DIAGNOSIS — E11621 Type 2 diabetes mellitus with foot ulcer: Secondary | ICD-10-CM | POA: Diagnosis not present

## 2015-12-17 DIAGNOSIS — L97529 Non-pressure chronic ulcer of other part of left foot with unspecified severity: Secondary | ICD-10-CM | POA: Diagnosis not present

## 2015-12-17 DIAGNOSIS — M6281 Muscle weakness (generalized): Secondary | ICD-10-CM | POA: Diagnosis not present

## 2015-12-25 DIAGNOSIS — E1151 Type 2 diabetes mellitus with diabetic peripheral angiopathy without gangrene: Secondary | ICD-10-CM | POA: Diagnosis not present

## 2015-12-25 DIAGNOSIS — Z8631 Personal history of diabetic foot ulcer: Secondary | ICD-10-CM | POA: Diagnosis not present

## 2015-12-25 DIAGNOSIS — D649 Anemia, unspecified: Secondary | ICD-10-CM | POA: Diagnosis not present

## 2015-12-25 DIAGNOSIS — M6281 Muscle weakness (generalized): Secondary | ICD-10-CM | POA: Diagnosis not present

## 2015-12-25 DIAGNOSIS — Z89422 Acquired absence of other left toe(s): Secondary | ICD-10-CM | POA: Diagnosis not present

## 2015-12-25 DIAGNOSIS — K219 Gastro-esophageal reflux disease without esophagitis: Secondary | ICD-10-CM | POA: Diagnosis not present

## 2015-12-25 DIAGNOSIS — L97519 Non-pressure chronic ulcer of other part of right foot with unspecified severity: Secondary | ICD-10-CM | POA: Diagnosis not present

## 2015-12-25 DIAGNOSIS — E11621 Type 2 diabetes mellitus with foot ulcer: Secondary | ICD-10-CM | POA: Diagnosis not present

## 2015-12-25 DIAGNOSIS — L97529 Non-pressure chronic ulcer of other part of left foot with unspecified severity: Secondary | ICD-10-CM | POA: Diagnosis not present

## 2016-01-02 DIAGNOSIS — D649 Anemia, unspecified: Secondary | ICD-10-CM | POA: Diagnosis not present

## 2016-01-02 DIAGNOSIS — E1151 Type 2 diabetes mellitus with diabetic peripheral angiopathy without gangrene: Secondary | ICD-10-CM | POA: Diagnosis not present

## 2016-01-02 DIAGNOSIS — E11621 Type 2 diabetes mellitus with foot ulcer: Secondary | ICD-10-CM | POA: Diagnosis not present

## 2016-01-02 DIAGNOSIS — Z89422 Acquired absence of other left toe(s): Secondary | ICD-10-CM | POA: Diagnosis not present

## 2016-01-02 DIAGNOSIS — M6281 Muscle weakness (generalized): Secondary | ICD-10-CM | POA: Diagnosis not present

## 2016-01-02 DIAGNOSIS — L97529 Non-pressure chronic ulcer of other part of left foot with unspecified severity: Secondary | ICD-10-CM | POA: Diagnosis not present

## 2016-01-02 DIAGNOSIS — K219 Gastro-esophageal reflux disease without esophagitis: Secondary | ICD-10-CM | POA: Diagnosis not present

## 2016-01-02 DIAGNOSIS — L97519 Non-pressure chronic ulcer of other part of right foot with unspecified severity: Secondary | ICD-10-CM | POA: Diagnosis not present

## 2016-01-02 DIAGNOSIS — Z8631 Personal history of diabetic foot ulcer: Secondary | ICD-10-CM | POA: Diagnosis not present

## 2016-01-08 DIAGNOSIS — L97529 Non-pressure chronic ulcer of other part of left foot with unspecified severity: Secondary | ICD-10-CM | POA: Diagnosis not present

## 2016-01-08 DIAGNOSIS — E1151 Type 2 diabetes mellitus with diabetic peripheral angiopathy without gangrene: Secondary | ICD-10-CM | POA: Diagnosis not present

## 2016-01-08 DIAGNOSIS — L97519 Non-pressure chronic ulcer of other part of right foot with unspecified severity: Secondary | ICD-10-CM | POA: Diagnosis not present

## 2016-01-08 DIAGNOSIS — D649 Anemia, unspecified: Secondary | ICD-10-CM | POA: Diagnosis not present

## 2016-01-08 DIAGNOSIS — Z8631 Personal history of diabetic foot ulcer: Secondary | ICD-10-CM | POA: Diagnosis not present

## 2016-01-08 DIAGNOSIS — K219 Gastro-esophageal reflux disease without esophagitis: Secondary | ICD-10-CM | POA: Diagnosis not present

## 2016-01-08 DIAGNOSIS — E11621 Type 2 diabetes mellitus with foot ulcer: Secondary | ICD-10-CM | POA: Diagnosis not present

## 2016-01-08 DIAGNOSIS — Z89422 Acquired absence of other left toe(s): Secondary | ICD-10-CM | POA: Diagnosis not present

## 2016-01-08 DIAGNOSIS — M6281 Muscle weakness (generalized): Secondary | ICD-10-CM | POA: Diagnosis not present

## 2016-01-12 DIAGNOSIS — R2689 Other abnormalities of gait and mobility: Secondary | ICD-10-CM | POA: Diagnosis not present

## 2016-01-12 DIAGNOSIS — M6281 Muscle weakness (generalized): Secondary | ICD-10-CM | POA: Diagnosis not present

## 2016-01-12 DIAGNOSIS — Z89422 Acquired absence of other left toe(s): Secondary | ICD-10-CM | POA: Diagnosis not present

## 2016-01-12 DIAGNOSIS — K219 Gastro-esophageal reflux disease without esophagitis: Secondary | ICD-10-CM | POA: Diagnosis not present

## 2016-01-12 DIAGNOSIS — E1151 Type 2 diabetes mellitus with diabetic peripheral angiopathy without gangrene: Secondary | ICD-10-CM | POA: Diagnosis not present

## 2016-01-12 DIAGNOSIS — I10 Essential (primary) hypertension: Secondary | ICD-10-CM | POA: Diagnosis not present

## 2016-01-12 DIAGNOSIS — R41841 Cognitive communication deficit: Secondary | ICD-10-CM | POA: Diagnosis not present

## 2016-01-15 DIAGNOSIS — K219 Gastro-esophageal reflux disease without esophagitis: Secondary | ICD-10-CM | POA: Diagnosis not present

## 2016-01-15 DIAGNOSIS — E11621 Type 2 diabetes mellitus with foot ulcer: Secondary | ICD-10-CM | POA: Diagnosis not present

## 2016-01-15 DIAGNOSIS — M6281 Muscle weakness (generalized): Secondary | ICD-10-CM | POA: Diagnosis not present

## 2016-01-15 DIAGNOSIS — Z8631 Personal history of diabetic foot ulcer: Secondary | ICD-10-CM | POA: Diagnosis not present

## 2016-01-15 DIAGNOSIS — D649 Anemia, unspecified: Secondary | ICD-10-CM | POA: Diagnosis not present

## 2016-01-15 DIAGNOSIS — L97529 Non-pressure chronic ulcer of other part of left foot with unspecified severity: Secondary | ICD-10-CM | POA: Diagnosis not present

## 2016-01-15 DIAGNOSIS — Z89422 Acquired absence of other left toe(s): Secondary | ICD-10-CM | POA: Diagnosis not present

## 2016-01-15 DIAGNOSIS — E1151 Type 2 diabetes mellitus with diabetic peripheral angiopathy without gangrene: Secondary | ICD-10-CM | POA: Diagnosis not present

## 2016-01-15 DIAGNOSIS — L97519 Non-pressure chronic ulcer of other part of right foot with unspecified severity: Secondary | ICD-10-CM | POA: Diagnosis not present

## 2016-01-22 DIAGNOSIS — K219 Gastro-esophageal reflux disease without esophagitis: Secondary | ICD-10-CM | POA: Diagnosis not present

## 2016-01-22 DIAGNOSIS — L97529 Non-pressure chronic ulcer of other part of left foot with unspecified severity: Secondary | ICD-10-CM | POA: Diagnosis not present

## 2016-01-22 DIAGNOSIS — Z8631 Personal history of diabetic foot ulcer: Secondary | ICD-10-CM | POA: Diagnosis not present

## 2016-01-22 DIAGNOSIS — Z89422 Acquired absence of other left toe(s): Secondary | ICD-10-CM | POA: Diagnosis not present

## 2016-01-22 DIAGNOSIS — M6281 Muscle weakness (generalized): Secondary | ICD-10-CM | POA: Diagnosis not present

## 2016-01-22 DIAGNOSIS — E11621 Type 2 diabetes mellitus with foot ulcer: Secondary | ICD-10-CM | POA: Diagnosis not present

## 2016-01-22 DIAGNOSIS — L97519 Non-pressure chronic ulcer of other part of right foot with unspecified severity: Secondary | ICD-10-CM | POA: Diagnosis not present

## 2016-01-22 DIAGNOSIS — D649 Anemia, unspecified: Secondary | ICD-10-CM | POA: Diagnosis not present

## 2016-01-22 DIAGNOSIS — E1151 Type 2 diabetes mellitus with diabetic peripheral angiopathy without gangrene: Secondary | ICD-10-CM | POA: Diagnosis not present

## 2016-01-29 DIAGNOSIS — E11621 Type 2 diabetes mellitus with foot ulcer: Secondary | ICD-10-CM | POA: Diagnosis not present

## 2016-01-29 DIAGNOSIS — L97529 Non-pressure chronic ulcer of other part of left foot with unspecified severity: Secondary | ICD-10-CM | POA: Diagnosis not present

## 2016-01-29 DIAGNOSIS — Z8631 Personal history of diabetic foot ulcer: Secondary | ICD-10-CM | POA: Diagnosis not present

## 2016-01-29 DIAGNOSIS — K219 Gastro-esophageal reflux disease without esophagitis: Secondary | ICD-10-CM | POA: Diagnosis not present

## 2016-01-29 DIAGNOSIS — Z89422 Acquired absence of other left toe(s): Secondary | ICD-10-CM | POA: Diagnosis not present

## 2016-01-29 DIAGNOSIS — L97519 Non-pressure chronic ulcer of other part of right foot with unspecified severity: Secondary | ICD-10-CM | POA: Diagnosis not present

## 2016-01-29 DIAGNOSIS — E1151 Type 2 diabetes mellitus with diabetic peripheral angiopathy without gangrene: Secondary | ICD-10-CM | POA: Diagnosis not present

## 2016-01-29 DIAGNOSIS — M6281 Muscle weakness (generalized): Secondary | ICD-10-CM | POA: Diagnosis not present

## 2016-01-29 DIAGNOSIS — D649 Anemia, unspecified: Secondary | ICD-10-CM | POA: Diagnosis not present

## 2016-02-05 DIAGNOSIS — Z8631 Personal history of diabetic foot ulcer: Secondary | ICD-10-CM | POA: Diagnosis not present

## 2016-02-05 DIAGNOSIS — E11621 Type 2 diabetes mellitus with foot ulcer: Secondary | ICD-10-CM | POA: Diagnosis not present

## 2016-02-05 DIAGNOSIS — L97519 Non-pressure chronic ulcer of other part of right foot with unspecified severity: Secondary | ICD-10-CM | POA: Diagnosis not present

## 2016-02-05 DIAGNOSIS — L97529 Non-pressure chronic ulcer of other part of left foot with unspecified severity: Secondary | ICD-10-CM | POA: Diagnosis not present

## 2016-02-05 DIAGNOSIS — Z89422 Acquired absence of other left toe(s): Secondary | ICD-10-CM | POA: Diagnosis not present

## 2016-02-05 DIAGNOSIS — K219 Gastro-esophageal reflux disease without esophagitis: Secondary | ICD-10-CM | POA: Diagnosis not present

## 2016-02-05 DIAGNOSIS — M6281 Muscle weakness (generalized): Secondary | ICD-10-CM | POA: Diagnosis not present

## 2016-02-05 DIAGNOSIS — E1151 Type 2 diabetes mellitus with diabetic peripheral angiopathy without gangrene: Secondary | ICD-10-CM | POA: Diagnosis not present

## 2016-02-05 DIAGNOSIS — D649 Anemia, unspecified: Secondary | ICD-10-CM | POA: Diagnosis not present

## 2016-02-07 DIAGNOSIS — E038 Other specified hypothyroidism: Secondary | ICD-10-CM | POA: Diagnosis not present

## 2016-02-07 DIAGNOSIS — K210 Gastro-esophageal reflux disease with esophagitis: Secondary | ICD-10-CM | POA: Diagnosis not present

## 2016-02-07 DIAGNOSIS — I10 Essential (primary) hypertension: Secondary | ICD-10-CM | POA: Diagnosis not present

## 2016-02-07 DIAGNOSIS — E1165 Type 2 diabetes mellitus with hyperglycemia: Secondary | ICD-10-CM | POA: Diagnosis not present

## 2016-02-07 DIAGNOSIS — E782 Mixed hyperlipidemia: Secondary | ICD-10-CM | POA: Diagnosis not present

## 2016-02-11 DIAGNOSIS — I10 Essential (primary) hypertension: Secondary | ICD-10-CM | POA: Diagnosis not present

## 2016-02-11 DIAGNOSIS — R41841 Cognitive communication deficit: Secondary | ICD-10-CM | POA: Diagnosis not present

## 2016-02-11 DIAGNOSIS — E1151 Type 2 diabetes mellitus with diabetic peripheral angiopathy without gangrene: Secondary | ICD-10-CM | POA: Diagnosis not present

## 2016-02-11 DIAGNOSIS — R2689 Other abnormalities of gait and mobility: Secondary | ICD-10-CM | POA: Diagnosis not present

## 2016-02-11 DIAGNOSIS — K219 Gastro-esophageal reflux disease without esophagitis: Secondary | ICD-10-CM | POA: Diagnosis not present

## 2016-02-11 DIAGNOSIS — M6281 Muscle weakness (generalized): Secondary | ICD-10-CM | POA: Diagnosis not present

## 2016-02-11 DIAGNOSIS — Z89422 Acquired absence of other left toe(s): Secondary | ICD-10-CM | POA: Diagnosis not present

## 2016-02-12 DIAGNOSIS — E1165 Type 2 diabetes mellitus with hyperglycemia: Secondary | ICD-10-CM | POA: Diagnosis not present

## 2016-02-12 DIAGNOSIS — I10 Essential (primary) hypertension: Secondary | ICD-10-CM | POA: Diagnosis not present

## 2016-02-12 DIAGNOSIS — E1122 Type 2 diabetes mellitus with diabetic chronic kidney disease: Secondary | ICD-10-CM | POA: Diagnosis not present

## 2016-02-12 DIAGNOSIS — F330 Major depressive disorder, recurrent, mild: Secondary | ICD-10-CM | POA: Diagnosis not present

## 2016-02-12 DIAGNOSIS — E782 Mixed hyperlipidemia: Secondary | ICD-10-CM | POA: Diagnosis not present

## 2016-02-12 DIAGNOSIS — N183 Chronic kidney disease, stage 3 (moderate): Secondary | ICD-10-CM | POA: Diagnosis not present

## 2016-02-12 DIAGNOSIS — E11621 Type 2 diabetes mellitus with foot ulcer: Secondary | ICD-10-CM | POA: Diagnosis not present

## 2016-02-12 DIAGNOSIS — E038 Other specified hypothyroidism: Secondary | ICD-10-CM | POA: Diagnosis not present

## 2016-03-13 DIAGNOSIS — M6281 Muscle weakness (generalized): Secondary | ICD-10-CM | POA: Diagnosis not present

## 2016-03-13 DIAGNOSIS — R41841 Cognitive communication deficit: Secondary | ICD-10-CM | POA: Diagnosis not present

## 2016-03-13 DIAGNOSIS — E1151 Type 2 diabetes mellitus with diabetic peripheral angiopathy without gangrene: Secondary | ICD-10-CM | POA: Diagnosis not present

## 2016-03-13 DIAGNOSIS — R2689 Other abnormalities of gait and mobility: Secondary | ICD-10-CM | POA: Diagnosis not present

## 2016-03-13 DIAGNOSIS — K219 Gastro-esophageal reflux disease without esophagitis: Secondary | ICD-10-CM | POA: Diagnosis not present

## 2016-03-13 DIAGNOSIS — I10 Essential (primary) hypertension: Secondary | ICD-10-CM | POA: Diagnosis not present

## 2016-03-13 DIAGNOSIS — Z89422 Acquired absence of other left toe(s): Secondary | ICD-10-CM | POA: Diagnosis not present

## 2016-03-26 DIAGNOSIS — E038 Other specified hypothyroidism: Secondary | ICD-10-CM | POA: Diagnosis not present

## 2016-04-13 DIAGNOSIS — M6281 Muscle weakness (generalized): Secondary | ICD-10-CM | POA: Diagnosis not present

## 2016-04-13 DIAGNOSIS — R2689 Other abnormalities of gait and mobility: Secondary | ICD-10-CM | POA: Diagnosis not present

## 2016-04-13 DIAGNOSIS — R41841 Cognitive communication deficit: Secondary | ICD-10-CM | POA: Diagnosis not present

## 2016-04-13 DIAGNOSIS — I10 Essential (primary) hypertension: Secondary | ICD-10-CM | POA: Diagnosis not present

## 2016-04-13 DIAGNOSIS — E1151 Type 2 diabetes mellitus with diabetic peripheral angiopathy without gangrene: Secondary | ICD-10-CM | POA: Diagnosis not present

## 2016-04-13 DIAGNOSIS — K219 Gastro-esophageal reflux disease without esophagitis: Secondary | ICD-10-CM | POA: Diagnosis not present

## 2016-04-13 DIAGNOSIS — Z89422 Acquired absence of other left toe(s): Secondary | ICD-10-CM | POA: Diagnosis not present

## 2016-05-13 DIAGNOSIS — E1151 Type 2 diabetes mellitus with diabetic peripheral angiopathy without gangrene: Secondary | ICD-10-CM | POA: Diagnosis not present

## 2016-05-13 DIAGNOSIS — Z89422 Acquired absence of other left toe(s): Secondary | ICD-10-CM | POA: Diagnosis not present

## 2016-05-13 DIAGNOSIS — K219 Gastro-esophageal reflux disease without esophagitis: Secondary | ICD-10-CM | POA: Diagnosis not present

## 2016-05-13 DIAGNOSIS — R2689 Other abnormalities of gait and mobility: Secondary | ICD-10-CM | POA: Diagnosis not present

## 2016-05-13 DIAGNOSIS — M6281 Muscle weakness (generalized): Secondary | ICD-10-CM | POA: Diagnosis not present

## 2016-05-13 DIAGNOSIS — I10 Essential (primary) hypertension: Secondary | ICD-10-CM | POA: Diagnosis not present

## 2016-05-13 DIAGNOSIS — R41841 Cognitive communication deficit: Secondary | ICD-10-CM | POA: Diagnosis not present

## 2016-06-10 DIAGNOSIS — E782 Mixed hyperlipidemia: Secondary | ICD-10-CM | POA: Diagnosis not present

## 2016-06-10 DIAGNOSIS — I10 Essential (primary) hypertension: Secondary | ICD-10-CM | POA: Diagnosis not present

## 2016-06-10 DIAGNOSIS — E038 Other specified hypothyroidism: Secondary | ICD-10-CM | POA: Diagnosis not present

## 2016-06-10 DIAGNOSIS — E1165 Type 2 diabetes mellitus with hyperglycemia: Secondary | ICD-10-CM | POA: Diagnosis not present

## 2016-06-13 DIAGNOSIS — I10 Essential (primary) hypertension: Secondary | ICD-10-CM | POA: Diagnosis not present

## 2016-06-13 DIAGNOSIS — E1151 Type 2 diabetes mellitus with diabetic peripheral angiopathy without gangrene: Secondary | ICD-10-CM | POA: Diagnosis not present

## 2016-06-13 DIAGNOSIS — Z89422 Acquired absence of other left toe(s): Secondary | ICD-10-CM | POA: Diagnosis not present

## 2016-06-13 DIAGNOSIS — M6281 Muscle weakness (generalized): Secondary | ICD-10-CM | POA: Diagnosis not present

## 2016-06-13 DIAGNOSIS — R2689 Other abnormalities of gait and mobility: Secondary | ICD-10-CM | POA: Diagnosis not present

## 2016-06-13 DIAGNOSIS — R41841 Cognitive communication deficit: Secondary | ICD-10-CM | POA: Diagnosis not present

## 2016-06-13 DIAGNOSIS — K219 Gastro-esophageal reflux disease without esophagitis: Secondary | ICD-10-CM | POA: Diagnosis not present

## 2016-06-17 DIAGNOSIS — E782 Mixed hyperlipidemia: Secondary | ICD-10-CM | POA: Diagnosis not present

## 2016-06-17 DIAGNOSIS — Z23 Encounter for immunization: Secondary | ICD-10-CM | POA: Diagnosis not present

## 2016-06-17 DIAGNOSIS — I10 Essential (primary) hypertension: Secondary | ICD-10-CM | POA: Diagnosis not present

## 2016-06-17 DIAGNOSIS — N183 Chronic kidney disease, stage 3 (moderate): Secondary | ICD-10-CM | POA: Diagnosis not present

## 2016-06-17 DIAGNOSIS — E1122 Type 2 diabetes mellitus with diabetic chronic kidney disease: Secondary | ICD-10-CM | POA: Diagnosis not present

## 2016-06-17 DIAGNOSIS — E1165 Type 2 diabetes mellitus with hyperglycemia: Secondary | ICD-10-CM | POA: Diagnosis not present

## 2016-06-17 DIAGNOSIS — E038 Other specified hypothyroidism: Secondary | ICD-10-CM | POA: Diagnosis not present

## 2016-06-17 DIAGNOSIS — F330 Major depressive disorder, recurrent, mild: Secondary | ICD-10-CM | POA: Diagnosis not present

## 2016-07-13 DIAGNOSIS — I10 Essential (primary) hypertension: Secondary | ICD-10-CM | POA: Diagnosis not present

## 2016-07-13 DIAGNOSIS — K219 Gastro-esophageal reflux disease without esophagitis: Secondary | ICD-10-CM | POA: Diagnosis not present

## 2016-07-13 DIAGNOSIS — E1151 Type 2 diabetes mellitus with diabetic peripheral angiopathy without gangrene: Secondary | ICD-10-CM | POA: Diagnosis not present

## 2016-07-13 DIAGNOSIS — R2689 Other abnormalities of gait and mobility: Secondary | ICD-10-CM | POA: Diagnosis not present

## 2016-07-13 DIAGNOSIS — M6281 Muscle weakness (generalized): Secondary | ICD-10-CM | POA: Diagnosis not present

## 2016-07-13 DIAGNOSIS — Z89422 Acquired absence of other left toe(s): Secondary | ICD-10-CM | POA: Diagnosis not present

## 2016-07-13 DIAGNOSIS — R41841 Cognitive communication deficit: Secondary | ICD-10-CM | POA: Diagnosis not present

## 2016-08-13 DIAGNOSIS — R2689 Other abnormalities of gait and mobility: Secondary | ICD-10-CM | POA: Diagnosis not present

## 2016-08-13 DIAGNOSIS — Z89422 Acquired absence of other left toe(s): Secondary | ICD-10-CM | POA: Diagnosis not present

## 2016-08-13 DIAGNOSIS — I10 Essential (primary) hypertension: Secondary | ICD-10-CM | POA: Diagnosis not present

## 2016-08-13 DIAGNOSIS — R41841 Cognitive communication deficit: Secondary | ICD-10-CM | POA: Diagnosis not present

## 2016-08-13 DIAGNOSIS — E1151 Type 2 diabetes mellitus with diabetic peripheral angiopathy without gangrene: Secondary | ICD-10-CM | POA: Diagnosis not present

## 2016-08-13 DIAGNOSIS — K219 Gastro-esophageal reflux disease without esophagitis: Secondary | ICD-10-CM | POA: Diagnosis not present

## 2016-08-13 DIAGNOSIS — M6281 Muscle weakness (generalized): Secondary | ICD-10-CM | POA: Diagnosis not present

## 2016-09-10 DIAGNOSIS — Z79899 Other long term (current) drug therapy: Secondary | ICD-10-CM | POA: Diagnosis not present

## 2016-09-10 DIAGNOSIS — I10 Essential (primary) hypertension: Secondary | ICD-10-CM | POA: Diagnosis not present

## 2016-09-10 DIAGNOSIS — E039 Hypothyroidism, unspecified: Secondary | ICD-10-CM | POA: Diagnosis not present

## 2016-09-10 DIAGNOSIS — R0602 Shortness of breath: Secondary | ICD-10-CM | POA: Diagnosis not present

## 2016-09-10 DIAGNOSIS — R05 Cough: Secondary | ICD-10-CM | POA: Diagnosis not present

## 2016-09-10 DIAGNOSIS — Z7984 Long term (current) use of oral hypoglycemic drugs: Secondary | ICD-10-CM | POA: Diagnosis not present

## 2016-09-10 DIAGNOSIS — R0600 Dyspnea, unspecified: Secondary | ICD-10-CM | POA: Diagnosis not present

## 2016-09-10 DIAGNOSIS — R1012 Left upper quadrant pain: Secondary | ICD-10-CM | POA: Diagnosis not present

## 2016-09-10 DIAGNOSIS — Z7982 Long term (current) use of aspirin: Secondary | ICD-10-CM | POA: Diagnosis not present

## 2016-09-10 DIAGNOSIS — E119 Type 2 diabetes mellitus without complications: Secondary | ICD-10-CM | POA: Diagnosis not present

## 2016-10-14 DIAGNOSIS — E782 Mixed hyperlipidemia: Secondary | ICD-10-CM | POA: Diagnosis not present

## 2016-10-14 DIAGNOSIS — E1122 Type 2 diabetes mellitus with diabetic chronic kidney disease: Secondary | ICD-10-CM | POA: Diagnosis not present

## 2016-10-14 DIAGNOSIS — E11621 Type 2 diabetes mellitus with foot ulcer: Secondary | ICD-10-CM | POA: Diagnosis not present

## 2016-10-14 DIAGNOSIS — E1142 Type 2 diabetes mellitus with diabetic polyneuropathy: Secondary | ICD-10-CM | POA: Diagnosis not present

## 2016-10-14 DIAGNOSIS — D631 Anemia in chronic kidney disease: Secondary | ICD-10-CM | POA: Diagnosis not present

## 2016-10-14 DIAGNOSIS — E038 Other specified hypothyroidism: Secondary | ICD-10-CM | POA: Diagnosis not present

## 2016-10-14 DIAGNOSIS — E7800 Pure hypercholesterolemia, unspecified: Secondary | ICD-10-CM | POA: Diagnosis not present

## 2016-10-14 DIAGNOSIS — E1165 Type 2 diabetes mellitus with hyperglycemia: Secondary | ICD-10-CM | POA: Diagnosis not present

## 2016-10-14 DIAGNOSIS — K210 Gastro-esophageal reflux disease with esophagitis: Secondary | ICD-10-CM | POA: Diagnosis not present

## 2016-10-16 DIAGNOSIS — E1165 Type 2 diabetes mellitus with hyperglycemia: Secondary | ICD-10-CM | POA: Diagnosis not present

## 2016-10-16 DIAGNOSIS — I10 Essential (primary) hypertension: Secondary | ICD-10-CM | POA: Diagnosis not present

## 2016-10-16 DIAGNOSIS — E1122 Type 2 diabetes mellitus with diabetic chronic kidney disease: Secondary | ICD-10-CM | POA: Diagnosis not present

## 2016-10-16 DIAGNOSIS — E782 Mixed hyperlipidemia: Secondary | ICD-10-CM | POA: Diagnosis not present

## 2016-10-16 DIAGNOSIS — F330 Major depressive disorder, recurrent, mild: Secondary | ICD-10-CM | POA: Diagnosis not present

## 2016-10-16 DIAGNOSIS — E038 Other specified hypothyroidism: Secondary | ICD-10-CM | POA: Diagnosis not present

## 2016-10-16 DIAGNOSIS — E11621 Type 2 diabetes mellitus with foot ulcer: Secondary | ICD-10-CM | POA: Diagnosis not present

## 2016-10-16 DIAGNOSIS — N183 Chronic kidney disease, stage 3 (moderate): Secondary | ICD-10-CM | POA: Diagnosis not present

## 2016-11-06 DIAGNOSIS — E119 Type 2 diabetes mellitus without complications: Secondary | ICD-10-CM | POA: Diagnosis not present

## 2016-11-06 DIAGNOSIS — N281 Cyst of kidney, acquired: Secondary | ICD-10-CM | POA: Diagnosis not present

## 2016-11-06 DIAGNOSIS — E039 Hypothyroidism, unspecified: Secondary | ICD-10-CM | POA: Diagnosis not present

## 2016-11-06 DIAGNOSIS — R319 Hematuria, unspecified: Secondary | ICD-10-CM | POA: Diagnosis not present

## 2016-11-06 DIAGNOSIS — Z7982 Long term (current) use of aspirin: Secondary | ICD-10-CM | POA: Diagnosis not present

## 2016-11-06 DIAGNOSIS — Z79899 Other long term (current) drug therapy: Secondary | ICD-10-CM | POA: Diagnosis not present

## 2016-11-06 DIAGNOSIS — Z7984 Long term (current) use of oral hypoglycemic drugs: Secondary | ICD-10-CM | POA: Diagnosis not present

## 2016-11-06 DIAGNOSIS — R1012 Left upper quadrant pain: Secondary | ICD-10-CM | POA: Diagnosis not present

## 2016-11-06 DIAGNOSIS — N390 Urinary tract infection, site not specified: Secondary | ICD-10-CM | POA: Diagnosis not present

## 2016-11-06 DIAGNOSIS — R1032 Left lower quadrant pain: Secondary | ICD-10-CM | POA: Diagnosis not present

## 2016-11-06 DIAGNOSIS — F419 Anxiety disorder, unspecified: Secondary | ICD-10-CM | POA: Diagnosis not present

## 2016-11-06 DIAGNOSIS — R10812 Left upper quadrant abdominal tenderness: Secondary | ICD-10-CM | POA: Diagnosis not present

## 2016-11-06 DIAGNOSIS — I10 Essential (primary) hypertension: Secondary | ICD-10-CM | POA: Diagnosis not present

## 2016-11-06 DIAGNOSIS — K2970 Gastritis, unspecified, without bleeding: Secondary | ICD-10-CM | POA: Diagnosis not present

## 2016-11-06 DIAGNOSIS — J9811 Atelectasis: Secondary | ICD-10-CM | POA: Diagnosis not present

## 2016-11-16 DIAGNOSIS — B965 Pseudomonas (aeruginosa) (mallei) (pseudomallei) as the cause of diseases classified elsewhere: Secondary | ICD-10-CM | POA: Diagnosis not present

## 2016-11-16 DIAGNOSIS — K219 Gastro-esophageal reflux disease without esophagitis: Secondary | ICD-10-CM | POA: Diagnosis not present

## 2016-11-16 DIAGNOSIS — Z7401 Bed confinement status: Secondary | ICD-10-CM | POA: Diagnosis not present

## 2016-11-16 DIAGNOSIS — R0902 Hypoxemia: Secondary | ICD-10-CM | POA: Diagnosis not present

## 2016-11-16 DIAGNOSIS — E871 Hypo-osmolality and hyponatremia: Secondary | ICD-10-CM | POA: Diagnosis not present

## 2016-11-16 DIAGNOSIS — N390 Urinary tract infection, site not specified: Secondary | ICD-10-CM | POA: Diagnosis not present

## 2016-11-16 DIAGNOSIS — R5381 Other malaise: Secondary | ICD-10-CM | POA: Diagnosis not present

## 2016-11-16 DIAGNOSIS — R197 Diarrhea, unspecified: Secondary | ICD-10-CM | POA: Diagnosis not present

## 2016-11-16 DIAGNOSIS — R4182 Altered mental status, unspecified: Secondary | ICD-10-CM | POA: Diagnosis not present

## 2016-11-16 DIAGNOSIS — E860 Dehydration: Secondary | ICD-10-CM | POA: Diagnosis not present

## 2016-11-16 DIAGNOSIS — R279 Unspecified lack of coordination: Secondary | ICD-10-CM | POA: Diagnosis not present

## 2016-11-16 DIAGNOSIS — R069 Unspecified abnormalities of breathing: Secondary | ICD-10-CM | POA: Diagnosis not present

## 2016-11-16 DIAGNOSIS — F4489 Other dissociative and conversion disorders: Secondary | ICD-10-CM | POA: Diagnosis not present

## 2016-11-16 DIAGNOSIS — I10 Essential (primary) hypertension: Secondary | ICD-10-CM | POA: Diagnosis not present

## 2016-11-16 DIAGNOSIS — E039 Hypothyroidism, unspecified: Secondary | ICD-10-CM | POA: Diagnosis not present

## 2016-11-16 DIAGNOSIS — A0472 Enterocolitis due to Clostridium difficile, not specified as recurrent: Secondary | ICD-10-CM | POA: Diagnosis not present

## 2016-11-23 DIAGNOSIS — E039 Hypothyroidism, unspecified: Secondary | ICD-10-CM | POA: Diagnosis not present

## 2016-11-23 DIAGNOSIS — R419 Unspecified symptoms and signs involving cognitive functions and awareness: Secondary | ICD-10-CM | POA: Diagnosis not present

## 2016-11-23 DIAGNOSIS — A0472 Enterocolitis due to Clostridium difficile, not specified as recurrent: Secondary | ICD-10-CM | POA: Diagnosis not present

## 2016-11-23 DIAGNOSIS — Z7984 Long term (current) use of oral hypoglycemic drugs: Secondary | ICD-10-CM | POA: Diagnosis not present

## 2016-11-23 DIAGNOSIS — K219 Gastro-esophageal reflux disease without esophagitis: Secondary | ICD-10-CM | POA: Diagnosis not present

## 2016-11-23 DIAGNOSIS — Z7982 Long term (current) use of aspirin: Secondary | ICD-10-CM | POA: Diagnosis not present

## 2016-11-23 DIAGNOSIS — M6281 Muscle weakness (generalized): Secondary | ICD-10-CM | POA: Diagnosis not present

## 2016-11-23 DIAGNOSIS — I10 Essential (primary) hypertension: Secondary | ICD-10-CM | POA: Diagnosis not present

## 2016-11-23 DIAGNOSIS — E1151 Type 2 diabetes mellitus with diabetic peripheral angiopathy without gangrene: Secondary | ICD-10-CM | POA: Diagnosis not present

## 2016-11-24 ENCOUNTER — Other Ambulatory Visit: Payer: Self-pay | Admitting: *Deleted

## 2016-11-24 ENCOUNTER — Other Ambulatory Visit: Payer: Self-pay | Admitting: Licensed Clinical Social Worker

## 2016-11-24 DIAGNOSIS — I10 Essential (primary) hypertension: Secondary | ICD-10-CM | POA: Diagnosis not present

## 2016-11-24 DIAGNOSIS — Z7982 Long term (current) use of aspirin: Secondary | ICD-10-CM | POA: Diagnosis not present

## 2016-11-24 DIAGNOSIS — A0472 Enterocolitis due to Clostridium difficile, not specified as recurrent: Secondary | ICD-10-CM | POA: Diagnosis not present

## 2016-11-24 DIAGNOSIS — E1151 Type 2 diabetes mellitus with diabetic peripheral angiopathy without gangrene: Secondary | ICD-10-CM | POA: Diagnosis not present

## 2016-11-24 DIAGNOSIS — E039 Hypothyroidism, unspecified: Secondary | ICD-10-CM | POA: Diagnosis not present

## 2016-11-24 DIAGNOSIS — R419 Unspecified symptoms and signs involving cognitive functions and awareness: Secondary | ICD-10-CM | POA: Diagnosis not present

## 2016-11-24 DIAGNOSIS — M6281 Muscle weakness (generalized): Secondary | ICD-10-CM | POA: Diagnosis not present

## 2016-11-24 DIAGNOSIS — Z7984 Long term (current) use of oral hypoglycemic drugs: Secondary | ICD-10-CM | POA: Diagnosis not present

## 2016-11-24 DIAGNOSIS — K219 Gastro-esophageal reflux disease without esophagitis: Secondary | ICD-10-CM | POA: Diagnosis not present

## 2016-11-24 NOTE — Patient Outreach (Signed)
[facility] Care Management  11/24/2016  Sonya Barnett 03/23/1924 [id number]   Telephone Screen  Referral Date: 11/24/16 [facility] Reason: [facility] Insurance: [facility]    Outreach attempt # 1 spoke with patient's daughter. Patient gave permission to speak with her daughter, Sonya DroneBrenda. Patient's daughter requested a return phone call in 20 minutes. HIPAA verified.     Plan: RN CM will return phone call within 1 hour.   Sonya ClevelandJuanita Maui Britten, RN, BSN, MHA/MSL, [facility] Telephonic Care Manager Coordinator [facility] Direct Phone: [phone] Toll Free: [phone] Fax: [phone]

## 2016-11-24 NOTE — Patient Outreach (Signed)
[facility] Care Management  11/24/2016  Sonya Barnett 11/17/1923 [id number]  Telephone Screen  Referral Date: 11/24/16 [facility] Referral Reason: [facility]  Outpatient attempt # 2 spoke with daughter, Steward DroneBrenda about patient's last hospitalization. HIPAA verified with daughter.   Social:   Steward DroneBrenda, the daughter lives with the patient. Steward DroneBrenda verbalized being the caregiver for her mother. Per Steward DroneBrenda, her mother is bedridden and she is totally dependent for all ADLs.  Patient has a RW, hospital bed, [place], glucometer, grab bars, raised toilet seat, and a wheelchair in the home. Home health RN, PT/OT/ST, SW, and CNA has been arranged with [facility]. Home health services will start today, 11/24/16.  Conditions: Past Medical Hx: DM HTN, Thyroid Disease, UTI, C-Diff Prior to being admitted in the hospital, the patient was capable of sitting on the side of the bed and feeding herself. Since being discharged from the hospital, the patient is dependent upon all ADLs. She is alert and oriented, but bedridden. According to the daughter, her brother went to DSS, on 11/24/16, to apply for long term Medicaid. The daughter stated if her mother is approved for Medicaid, the [facility] [place]. Patient was diagnosed with C-Diff and a UTI while inpatient [facility].  Medications: The daughter verbalized patient takes a total of 8 medications per day. Steward DroneBrenda reported, patient being able to afford medications and taking them as prescribed. The daughter did not questions/concerns regarding her mother's medications.   Appointments: The patient is bedridden and she is not capable of going to any medical appointments, per Steward DroneBrenda.  Steward DroneBrenda verbalized [facility] will have to communicate her mother's condition with the MD.. Steward DroneBrenda could not recall her mother's last  PCP appointment.  Advanced Directives: Patient doesn't have an Scientist, water qualityAdvanced Directive, per Steward DroneBrenda. Steward DroneBrenda declined information regarding Advanced Directives.  Consent: [facility] services reviewed and discussed with Steward DroneBrenda. Verbal consent given for services.   Plan: RN CM will send [facility] referral for possible assistance with community resources and placement. RN CM advised patient to contact RN CM for any needs or concerns. RN CM provided patient with [facility] 24hr [facility] Line contact info.  Wynelle ClevelandJuanita Blondine Hottel, RN, BSN, MHA/MSL, [facility] Telephonic Care Manager Coordinator [facility] Direct Phone: [phone] Toll Free: [phone] Fax: [phone]

## 2016-11-24 NOTE — Patient Outreach (Signed)
Assessment:  [name] forwarded referral order for [name] to [employer]Kelly Harrison, LCSW on 11/24/16.  Kelton PillarMichael S.Thadeus Gandolfi MSW, LCSW Licensed Clinical Social Worker [facility] Care Management [phone]

## 2016-11-25 ENCOUNTER — Other Ambulatory Visit: Payer: Self-pay | Admitting: *Deleted

## 2016-11-25 DIAGNOSIS — E1151 Type 2 diabetes mellitus with diabetic peripheral angiopathy without gangrene: Secondary | ICD-10-CM | POA: Diagnosis not present

## 2016-11-25 DIAGNOSIS — M6281 Muscle weakness (generalized): Secondary | ICD-10-CM | POA: Diagnosis not present

## 2016-11-25 DIAGNOSIS — E039 Hypothyroidism, unspecified: Secondary | ICD-10-CM | POA: Diagnosis not present

## 2016-11-25 DIAGNOSIS — Z7982 Long term (current) use of aspirin: Secondary | ICD-10-CM | POA: Diagnosis not present

## 2016-11-25 DIAGNOSIS — R419 Unspecified symptoms and signs involving cognitive functions and awareness: Secondary | ICD-10-CM | POA: Diagnosis not present

## 2016-11-25 DIAGNOSIS — Z7984 Long term (current) use of oral hypoglycemic drugs: Secondary | ICD-10-CM | POA: Diagnosis not present

## 2016-11-25 DIAGNOSIS — K219 Gastro-esophageal reflux disease without esophagitis: Secondary | ICD-10-CM | POA: Diagnosis not present

## 2016-11-25 DIAGNOSIS — I10 Essential (primary) hypertension: Secondary | ICD-10-CM | POA: Diagnosis not present

## 2016-11-25 DIAGNOSIS — A0472 Enterocolitis due to Clostridium difficile, not specified as recurrent: Secondary | ICD-10-CM | POA: Diagnosis not present

## 2016-11-25 NOTE — Patient Outreach (Signed)
[facility] Care Management  11/25/2016  Lyla SonOpalene Sonya Barnett 08/20/1923 [id number]   CSW received referral from Telephonic RN, Dorann LodgeJuanita for community resources, long term Medicaid & SNF placement. CSW called & spoke with patient's daughter, Steward DroneBrenda [phone] to schedule a home visit for Tuesday, 5/15 at 1pm. CSW will follow with full assessment & note at that time.    Lincoln MaxinKelly Han Lysne, LCSW [facility]  Clinical Social Worker cell #: [phone]

## 2016-11-26 DIAGNOSIS — A0472 Enterocolitis due to Clostridium difficile, not specified as recurrent: Secondary | ICD-10-CM | POA: Diagnosis not present

## 2016-11-26 DIAGNOSIS — R419 Unspecified symptoms and signs involving cognitive functions and awareness: Secondary | ICD-10-CM | POA: Diagnosis not present

## 2016-11-26 DIAGNOSIS — E039 Hypothyroidism, unspecified: Secondary | ICD-10-CM | POA: Diagnosis not present

## 2016-11-26 DIAGNOSIS — Z7984 Long term (current) use of oral hypoglycemic drugs: Secondary | ICD-10-CM | POA: Diagnosis not present

## 2016-11-26 DIAGNOSIS — Z7982 Long term (current) use of aspirin: Secondary | ICD-10-CM | POA: Diagnosis not present

## 2016-11-26 DIAGNOSIS — E1151 Type 2 diabetes mellitus with diabetic peripheral angiopathy without gangrene: Secondary | ICD-10-CM | POA: Diagnosis not present

## 2016-11-26 DIAGNOSIS — K219 Gastro-esophageal reflux disease without esophagitis: Secondary | ICD-10-CM | POA: Diagnosis not present

## 2016-11-26 DIAGNOSIS — M6281 Muscle weakness (generalized): Secondary | ICD-10-CM | POA: Diagnosis not present

## 2016-11-26 DIAGNOSIS — I10 Essential (primary) hypertension: Secondary | ICD-10-CM | POA: Diagnosis not present

## 2016-11-28 DIAGNOSIS — Z7982 Long term (current) use of aspirin: Secondary | ICD-10-CM | POA: Diagnosis not present

## 2016-11-28 DIAGNOSIS — A0472 Enterocolitis due to Clostridium difficile, not specified as recurrent: Secondary | ICD-10-CM | POA: Diagnosis not present

## 2016-11-28 DIAGNOSIS — Z7984 Long term (current) use of oral hypoglycemic drugs: Secondary | ICD-10-CM | POA: Diagnosis not present

## 2016-11-28 DIAGNOSIS — E1151 Type 2 diabetes mellitus with diabetic peripheral angiopathy without gangrene: Secondary | ICD-10-CM | POA: Diagnosis not present

## 2016-11-28 DIAGNOSIS — E039 Hypothyroidism, unspecified: Secondary | ICD-10-CM | POA: Diagnosis not present

## 2016-11-28 DIAGNOSIS — R419 Unspecified symptoms and signs involving cognitive functions and awareness: Secondary | ICD-10-CM | POA: Diagnosis not present

## 2016-11-28 DIAGNOSIS — K219 Gastro-esophageal reflux disease without esophagitis: Secondary | ICD-10-CM | POA: Diagnosis not present

## 2016-11-28 DIAGNOSIS — M6281 Muscle weakness (generalized): Secondary | ICD-10-CM | POA: Diagnosis not present

## 2016-11-28 DIAGNOSIS — I10 Essential (primary) hypertension: Secondary | ICD-10-CM | POA: Diagnosis not present

## 2016-12-01 DIAGNOSIS — E039 Hypothyroidism, unspecified: Secondary | ICD-10-CM | POA: Diagnosis not present

## 2016-12-01 DIAGNOSIS — E1151 Type 2 diabetes mellitus with diabetic peripheral angiopathy without gangrene: Secondary | ICD-10-CM | POA: Diagnosis not present

## 2016-12-01 DIAGNOSIS — A0472 Enterocolitis due to Clostridium difficile, not specified as recurrent: Secondary | ICD-10-CM | POA: Diagnosis not present

## 2016-12-01 DIAGNOSIS — K219 Gastro-esophageal reflux disease without esophagitis: Secondary | ICD-10-CM | POA: Diagnosis not present

## 2016-12-01 DIAGNOSIS — Z7982 Long term (current) use of aspirin: Secondary | ICD-10-CM | POA: Diagnosis not present

## 2016-12-01 DIAGNOSIS — Z7984 Long term (current) use of oral hypoglycemic drugs: Secondary | ICD-10-CM | POA: Diagnosis not present

## 2016-12-01 DIAGNOSIS — M6281 Muscle weakness (generalized): Secondary | ICD-10-CM | POA: Diagnosis not present

## 2016-12-01 DIAGNOSIS — R419 Unspecified symptoms and signs involving cognitive functions and awareness: Secondary | ICD-10-CM | POA: Diagnosis not present

## 2016-12-01 DIAGNOSIS — I10 Essential (primary) hypertension: Secondary | ICD-10-CM | POA: Diagnosis not present

## 2016-12-02 ENCOUNTER — Other Ambulatory Visit: Payer: Self-pay | Admitting: *Deleted

## 2016-12-02 ENCOUNTER — Encounter: Payer: Self-pay | Admitting: *Deleted

## 2016-12-02 DIAGNOSIS — R419 Unspecified symptoms and signs involving cognitive functions and awareness: Secondary | ICD-10-CM | POA: Diagnosis not present

## 2016-12-02 DIAGNOSIS — E039 Hypothyroidism, unspecified: Secondary | ICD-10-CM | POA: Diagnosis not present

## 2016-12-02 DIAGNOSIS — A0472 Enterocolitis due to Clostridium difficile, not specified as recurrent: Secondary | ICD-10-CM | POA: Diagnosis not present

## 2016-12-02 DIAGNOSIS — I10 Essential (primary) hypertension: Secondary | ICD-10-CM | POA: Diagnosis not present

## 2016-12-02 DIAGNOSIS — M6281 Muscle weakness (generalized): Secondary | ICD-10-CM | POA: Diagnosis not present

## 2016-12-02 DIAGNOSIS — K219 Gastro-esophageal reflux disease without esophagitis: Secondary | ICD-10-CM | POA: Diagnosis not present

## 2016-12-02 DIAGNOSIS — E1151 Type 2 diabetes mellitus with diabetic peripheral angiopathy without gangrene: Secondary | ICD-10-CM | POA: Diagnosis not present

## 2016-12-02 DIAGNOSIS — Z7982 Long term (current) use of aspirin: Secondary | ICD-10-CM | POA: Diagnosis not present

## 2016-12-02 DIAGNOSIS — Z7984 Long term (current) use of oral hypoglycemic drugs: Secondary | ICD-10-CM | POA: Diagnosis not present

## 2016-12-02 NOTE — Patient Outreach (Signed)
[facility]) Care Management  12/02/2016  Sonya Barnett 05/09/1924 [id number]   CSW met with patient & patient's daughter, Sonya Barnett at patient's home to complete initial home visit. Patient was laying in bed, eating the lunch that her daughter had prepared for her. Patient gave permission for CSW to speak with patient's daughter to complete initial assessment & to sign consent.   Patient's daughter states that she has been living with her for the past 4 years and is her caregiver along with her brother - Sonya Barnett, who assists with financials. Per Sonya Barnett had applied for Medicaid last Wednesday, 11/26/16 and was assigned a caseworker, Sonya Barnett at [employer] Sonya Barnett states that they are waiting for bank statements and medical bills to complete the application. Patient's son & daughter were hoping that if approved for Medicaid, patient would go to [facility], though patient is not agreeable with that plan at this time - states that she would prefer to stay at home. Patient's son feels that they will need to "spend down" her money to be able to qualify for Medicaid and plans to do home renovations (much needed roof, new refrigerator and floors) as patient may likely stay at home.   Patient was recently admitted to [facility] for UTI and discharged home on 11/21/16, patient is currently under the services of [facility]. Patient has an Therapist, sports, PT, OT and an aide coming out. Patient's daughter reports that patient is mostly bedbound though will situp at the edge of the bed to eat her meals, she has been participating with therapy when they come out and per daughter, she has not been complaining of hip pain anymore. Daughter attributes this to the exercises and having her move around more. Patient has a scheduled appointment with her PCP - Consuello Masse, MD on July 15th - daughter plans to call the rescue squad to transport her as she has done in the past. Per daughter, "they  will take her free of charge if they have volunteers that day." CSW will mail patient information on transportation & services through [place] as daughter states that she is sometimes afraid to leave her at the house by herself to run errands. CSW will follow-up by phone to patient's daughter in 3 weeks to follow-up on Medicaid application & brochures mailed to their home.   [facility] CM Care Plan Problem One     Most Recent Value  Care Plan Problem One  Patient with financial limitations and in need of community resources.  Role Documenting the Problem One  Clinical Social Worker  Care Plan for Problem One  Active  [facility] Long Term Goal (31-90 days)  Patient will apply for Medicaid and be linked with community resources as identified.  [facility] Long Term Goal Start Date  12/02/16  Interventions for Problem One Long Term Goal  CSW provided information to patient's daughter on how to apply for Medicaid & encouraged her to keep in contact with DSS Caseworker  [facility] CM Short Term Goal #1 (0-30 days)  Patient will complete Medicaid application and collect all outstanding medical bills & bank statements within the next 30 days.  [facility] CM Short Term Goal #1 Start Date  12/02/16  Interventions for Short Term Goal #1  CSW provided patient's daughter with list of information that DSS will typically ask for when applying for medicaid to anticipate needs for.        Raynaldo Opitz, [place]  Clinical Social Worker cell #: [phone]

## 2016-12-04 DIAGNOSIS — I10 Essential (primary) hypertension: Secondary | ICD-10-CM | POA: Diagnosis not present

## 2016-12-04 DIAGNOSIS — K219 Gastro-esophageal reflux disease without esophagitis: Secondary | ICD-10-CM | POA: Diagnosis not present

## 2016-12-04 DIAGNOSIS — Z7984 Long term (current) use of oral hypoglycemic drugs: Secondary | ICD-10-CM | POA: Diagnosis not present

## 2016-12-04 DIAGNOSIS — M6281 Muscle weakness (generalized): Secondary | ICD-10-CM | POA: Diagnosis not present

## 2016-12-04 DIAGNOSIS — E1151 Type 2 diabetes mellitus with diabetic peripheral angiopathy without gangrene: Secondary | ICD-10-CM | POA: Diagnosis not present

## 2016-12-04 DIAGNOSIS — R419 Unspecified symptoms and signs involving cognitive functions and awareness: Secondary | ICD-10-CM | POA: Diagnosis not present

## 2016-12-04 DIAGNOSIS — Z7982 Long term (current) use of aspirin: Secondary | ICD-10-CM | POA: Diagnosis not present

## 2016-12-04 DIAGNOSIS — A0472 Enterocolitis due to Clostridium difficile, not specified as recurrent: Secondary | ICD-10-CM | POA: Diagnosis not present

## 2016-12-04 DIAGNOSIS — E039 Hypothyroidism, unspecified: Secondary | ICD-10-CM | POA: Diagnosis not present

## 2016-12-05 DIAGNOSIS — K219 Gastro-esophageal reflux disease without esophagitis: Secondary | ICD-10-CM | POA: Diagnosis not present

## 2016-12-05 DIAGNOSIS — I10 Essential (primary) hypertension: Secondary | ICD-10-CM | POA: Diagnosis not present

## 2016-12-05 DIAGNOSIS — A0472 Enterocolitis due to Clostridium difficile, not specified as recurrent: Secondary | ICD-10-CM | POA: Diagnosis not present

## 2016-12-05 DIAGNOSIS — R419 Unspecified symptoms and signs involving cognitive functions and awareness: Secondary | ICD-10-CM | POA: Diagnosis not present

## 2016-12-05 DIAGNOSIS — E039 Hypothyroidism, unspecified: Secondary | ICD-10-CM | POA: Diagnosis not present

## 2016-12-05 DIAGNOSIS — E1151 Type 2 diabetes mellitus with diabetic peripheral angiopathy without gangrene: Secondary | ICD-10-CM | POA: Diagnosis not present

## 2016-12-05 DIAGNOSIS — M6281 Muscle weakness (generalized): Secondary | ICD-10-CM | POA: Diagnosis not present

## 2016-12-05 DIAGNOSIS — Z7984 Long term (current) use of oral hypoglycemic drugs: Secondary | ICD-10-CM | POA: Diagnosis not present

## 2016-12-05 DIAGNOSIS — Z7982 Long term (current) use of aspirin: Secondary | ICD-10-CM | POA: Diagnosis not present

## 2016-12-08 DIAGNOSIS — E039 Hypothyroidism, unspecified: Secondary | ICD-10-CM | POA: Diagnosis not present

## 2016-12-08 DIAGNOSIS — K219 Gastro-esophageal reflux disease without esophagitis: Secondary | ICD-10-CM | POA: Diagnosis not present

## 2016-12-08 DIAGNOSIS — M6281 Muscle weakness (generalized): Secondary | ICD-10-CM | POA: Diagnosis not present

## 2016-12-08 DIAGNOSIS — Z7982 Long term (current) use of aspirin: Secondary | ICD-10-CM | POA: Diagnosis not present

## 2016-12-08 DIAGNOSIS — Z7984 Long term (current) use of oral hypoglycemic drugs: Secondary | ICD-10-CM | POA: Diagnosis not present

## 2016-12-08 DIAGNOSIS — I10 Essential (primary) hypertension: Secondary | ICD-10-CM | POA: Diagnosis not present

## 2016-12-08 DIAGNOSIS — R419 Unspecified symptoms and signs involving cognitive functions and awareness: Secondary | ICD-10-CM | POA: Diagnosis not present

## 2016-12-08 DIAGNOSIS — E1151 Type 2 diabetes mellitus with diabetic peripheral angiopathy without gangrene: Secondary | ICD-10-CM | POA: Diagnosis not present

## 2016-12-08 DIAGNOSIS — A0472 Enterocolitis due to Clostridium difficile, not specified as recurrent: Secondary | ICD-10-CM | POA: Diagnosis not present

## 2016-12-09 DIAGNOSIS — Z7982 Long term (current) use of aspirin: Secondary | ICD-10-CM | POA: Diagnosis not present

## 2016-12-09 DIAGNOSIS — R419 Unspecified symptoms and signs involving cognitive functions and awareness: Secondary | ICD-10-CM | POA: Diagnosis not present

## 2016-12-09 DIAGNOSIS — Z7984 Long term (current) use of oral hypoglycemic drugs: Secondary | ICD-10-CM | POA: Diagnosis not present

## 2016-12-09 DIAGNOSIS — E039 Hypothyroidism, unspecified: Secondary | ICD-10-CM | POA: Diagnosis not present

## 2016-12-09 DIAGNOSIS — A0472 Enterocolitis due to Clostridium difficile, not specified as recurrent: Secondary | ICD-10-CM | POA: Diagnosis not present

## 2016-12-09 DIAGNOSIS — I10 Essential (primary) hypertension: Secondary | ICD-10-CM | POA: Diagnosis not present

## 2016-12-09 DIAGNOSIS — E1151 Type 2 diabetes mellitus with diabetic peripheral angiopathy without gangrene: Secondary | ICD-10-CM | POA: Diagnosis not present

## 2016-12-09 DIAGNOSIS — M6281 Muscle weakness (generalized): Secondary | ICD-10-CM | POA: Diagnosis not present

## 2016-12-09 DIAGNOSIS — K219 Gastro-esophageal reflux disease without esophagitis: Secondary | ICD-10-CM | POA: Diagnosis not present

## 2016-12-10 DIAGNOSIS — A0472 Enterocolitis due to Clostridium difficile, not specified as recurrent: Secondary | ICD-10-CM | POA: Diagnosis not present

## 2016-12-10 DIAGNOSIS — E1151 Type 2 diabetes mellitus with diabetic peripheral angiopathy without gangrene: Secondary | ICD-10-CM | POA: Diagnosis not present

## 2016-12-10 DIAGNOSIS — I10 Essential (primary) hypertension: Secondary | ICD-10-CM | POA: Diagnosis not present

## 2016-12-10 DIAGNOSIS — Z7984 Long term (current) use of oral hypoglycemic drugs: Secondary | ICD-10-CM | POA: Diagnosis not present

## 2016-12-10 DIAGNOSIS — K219 Gastro-esophageal reflux disease without esophagitis: Secondary | ICD-10-CM | POA: Diagnosis not present

## 2016-12-10 DIAGNOSIS — E039 Hypothyroidism, unspecified: Secondary | ICD-10-CM | POA: Diagnosis not present

## 2016-12-10 DIAGNOSIS — R419 Unspecified symptoms and signs involving cognitive functions and awareness: Secondary | ICD-10-CM | POA: Diagnosis not present

## 2016-12-10 DIAGNOSIS — M6281 Muscle weakness (generalized): Secondary | ICD-10-CM | POA: Diagnosis not present

## 2016-12-10 DIAGNOSIS — Z7982 Long term (current) use of aspirin: Secondary | ICD-10-CM | POA: Diagnosis not present

## 2016-12-11 DIAGNOSIS — Z7984 Long term (current) use of oral hypoglycemic drugs: Secondary | ICD-10-CM | POA: Diagnosis not present

## 2016-12-11 DIAGNOSIS — A0472 Enterocolitis due to Clostridium difficile, not specified as recurrent: Secondary | ICD-10-CM | POA: Diagnosis not present

## 2016-12-11 DIAGNOSIS — Z7982 Long term (current) use of aspirin: Secondary | ICD-10-CM | POA: Diagnosis not present

## 2016-12-11 DIAGNOSIS — I10 Essential (primary) hypertension: Secondary | ICD-10-CM | POA: Diagnosis not present

## 2016-12-11 DIAGNOSIS — K219 Gastro-esophageal reflux disease without esophagitis: Secondary | ICD-10-CM | POA: Diagnosis not present

## 2016-12-11 DIAGNOSIS — M6281 Muscle weakness (generalized): Secondary | ICD-10-CM | POA: Diagnosis not present

## 2016-12-11 DIAGNOSIS — E1151 Type 2 diabetes mellitus with diabetic peripheral angiopathy without gangrene: Secondary | ICD-10-CM | POA: Diagnosis not present

## 2016-12-11 DIAGNOSIS — R419 Unspecified symptoms and signs involving cognitive functions and awareness: Secondary | ICD-10-CM | POA: Diagnosis not present

## 2016-12-11 DIAGNOSIS — E039 Hypothyroidism, unspecified: Secondary | ICD-10-CM | POA: Diagnosis not present

## 2016-12-12 DIAGNOSIS — Z7982 Long term (current) use of aspirin: Secondary | ICD-10-CM | POA: Diagnosis not present

## 2016-12-12 DIAGNOSIS — I10 Essential (primary) hypertension: Secondary | ICD-10-CM | POA: Diagnosis not present

## 2016-12-12 DIAGNOSIS — K219 Gastro-esophageal reflux disease without esophagitis: Secondary | ICD-10-CM | POA: Diagnosis not present

## 2016-12-12 DIAGNOSIS — A0472 Enterocolitis due to Clostridium difficile, not specified as recurrent: Secondary | ICD-10-CM | POA: Diagnosis not present

## 2016-12-12 DIAGNOSIS — M6281 Muscle weakness (generalized): Secondary | ICD-10-CM | POA: Diagnosis not present

## 2016-12-12 DIAGNOSIS — R419 Unspecified symptoms and signs involving cognitive functions and awareness: Secondary | ICD-10-CM | POA: Diagnosis not present

## 2016-12-12 DIAGNOSIS — E1151 Type 2 diabetes mellitus with diabetic peripheral angiopathy without gangrene: Secondary | ICD-10-CM | POA: Diagnosis not present

## 2016-12-12 DIAGNOSIS — E039 Hypothyroidism, unspecified: Secondary | ICD-10-CM | POA: Diagnosis not present

## 2016-12-12 DIAGNOSIS — Z7984 Long term (current) use of oral hypoglycemic drugs: Secondary | ICD-10-CM | POA: Diagnosis not present

## 2016-12-15 DIAGNOSIS — I10 Essential (primary) hypertension: Secondary | ICD-10-CM | POA: Diagnosis not present

## 2016-12-15 DIAGNOSIS — R419 Unspecified symptoms and signs involving cognitive functions and awareness: Secondary | ICD-10-CM | POA: Diagnosis not present

## 2016-12-15 DIAGNOSIS — Z7982 Long term (current) use of aspirin: Secondary | ICD-10-CM | POA: Diagnosis not present

## 2016-12-15 DIAGNOSIS — E039 Hypothyroidism, unspecified: Secondary | ICD-10-CM | POA: Diagnosis not present

## 2016-12-15 DIAGNOSIS — E1151 Type 2 diabetes mellitus with diabetic peripheral angiopathy without gangrene: Secondary | ICD-10-CM | POA: Diagnosis not present

## 2016-12-15 DIAGNOSIS — A0472 Enterocolitis due to Clostridium difficile, not specified as recurrent: Secondary | ICD-10-CM | POA: Diagnosis not present

## 2016-12-15 DIAGNOSIS — Z7984 Long term (current) use of oral hypoglycemic drugs: Secondary | ICD-10-CM | POA: Diagnosis not present

## 2016-12-15 DIAGNOSIS — M6281 Muscle weakness (generalized): Secondary | ICD-10-CM | POA: Diagnosis not present

## 2016-12-15 DIAGNOSIS — K219 Gastro-esophageal reflux disease without esophagitis: Secondary | ICD-10-CM | POA: Diagnosis not present

## 2016-12-19 DIAGNOSIS — E039 Hypothyroidism, unspecified: Secondary | ICD-10-CM | POA: Diagnosis not present

## 2016-12-19 DIAGNOSIS — M6281 Muscle weakness (generalized): Secondary | ICD-10-CM | POA: Diagnosis not present

## 2016-12-19 DIAGNOSIS — E1151 Type 2 diabetes mellitus with diabetic peripheral angiopathy without gangrene: Secondary | ICD-10-CM | POA: Diagnosis not present

## 2016-12-19 DIAGNOSIS — Z7984 Long term (current) use of oral hypoglycemic drugs: Secondary | ICD-10-CM | POA: Diagnosis not present

## 2016-12-19 DIAGNOSIS — I10 Essential (primary) hypertension: Secondary | ICD-10-CM | POA: Diagnosis not present

## 2016-12-19 DIAGNOSIS — R419 Unspecified symptoms and signs involving cognitive functions and awareness: Secondary | ICD-10-CM | POA: Diagnosis not present

## 2016-12-19 DIAGNOSIS — A0472 Enterocolitis due to Clostridium difficile, not specified as recurrent: Secondary | ICD-10-CM | POA: Diagnosis not present

## 2016-12-19 DIAGNOSIS — K219 Gastro-esophageal reflux disease without esophagitis: Secondary | ICD-10-CM | POA: Diagnosis not present

## 2016-12-19 DIAGNOSIS — Z7982 Long term (current) use of aspirin: Secondary | ICD-10-CM | POA: Diagnosis not present

## 2016-12-22 DIAGNOSIS — A0472 Enterocolitis due to Clostridium difficile, not specified as recurrent: Secondary | ICD-10-CM | POA: Diagnosis not present

## 2016-12-22 DIAGNOSIS — E039 Hypothyroidism, unspecified: Secondary | ICD-10-CM | POA: Diagnosis not present

## 2016-12-22 DIAGNOSIS — I10 Essential (primary) hypertension: Secondary | ICD-10-CM | POA: Diagnosis not present

## 2016-12-22 DIAGNOSIS — M6281 Muscle weakness (generalized): Secondary | ICD-10-CM | POA: Diagnosis not present

## 2016-12-22 DIAGNOSIS — R419 Unspecified symptoms and signs involving cognitive functions and awareness: Secondary | ICD-10-CM | POA: Diagnosis not present

## 2016-12-22 DIAGNOSIS — Z7984 Long term (current) use of oral hypoglycemic drugs: Secondary | ICD-10-CM | POA: Diagnosis not present

## 2016-12-22 DIAGNOSIS — K219 Gastro-esophageal reflux disease without esophagitis: Secondary | ICD-10-CM | POA: Diagnosis not present

## 2016-12-22 DIAGNOSIS — Z7982 Long term (current) use of aspirin: Secondary | ICD-10-CM | POA: Diagnosis not present

## 2016-12-22 DIAGNOSIS — E1151 Type 2 diabetes mellitus with diabetic peripheral angiopathy without gangrene: Secondary | ICD-10-CM | POA: Diagnosis not present

## 2016-12-23 ENCOUNTER — Other Ambulatory Visit: Payer: Self-pay | Admitting: *Deleted

## 2016-12-23 DIAGNOSIS — Z7982 Long term (current) use of aspirin: Secondary | ICD-10-CM | POA: Diagnosis not present

## 2016-12-23 DIAGNOSIS — M6281 Muscle weakness (generalized): Secondary | ICD-10-CM | POA: Diagnosis not present

## 2016-12-23 DIAGNOSIS — E039 Hypothyroidism, unspecified: Secondary | ICD-10-CM | POA: Diagnosis not present

## 2016-12-23 DIAGNOSIS — A0472 Enterocolitis due to Clostridium difficile, not specified as recurrent: Secondary | ICD-10-CM | POA: Diagnosis not present

## 2016-12-23 DIAGNOSIS — Z7984 Long term (current) use of oral hypoglycemic drugs: Secondary | ICD-10-CM | POA: Diagnosis not present

## 2016-12-23 DIAGNOSIS — K219 Gastro-esophageal reflux disease without esophagitis: Secondary | ICD-10-CM | POA: Diagnosis not present

## 2016-12-23 DIAGNOSIS — E1151 Type 2 diabetes mellitus with diabetic peripheral angiopathy without gangrene: Secondary | ICD-10-CM | POA: Diagnosis not present

## 2016-12-23 DIAGNOSIS — I10 Essential (primary) hypertension: Secondary | ICD-10-CM | POA: Diagnosis not present

## 2016-12-23 DIAGNOSIS — R419 Unspecified symptoms and signs involving cognitive functions and awareness: Secondary | ICD-10-CM | POA: Diagnosis not present

## 2016-12-23 NOTE — Patient Outreach (Signed)
[facility]) Care Management  12/23/2016  Sonya Barnett 05/08/1924 [id number]   CSW called patient's daughter, Steward DroneBrenda to follow-up on initial home visit. Patient's son, Peyton NajjarLarry has submitted Medicaid application to DSS, they are just waiting for medical bills to complete application. Patient has an appointment with Dr. Neita CarpSasser on July 15th and the daughter is concerned about transportation - she is planning to call the rescue squad as they have done in the past. CSW directed patient's daughter to call Aging, Disability & Transit Services to see if they would be able to transport patient via [employer]. Patient's daughter is concerned that patient is unable to turn and pivot and therefore, would require additional assistance. CSW reviewed [employer] website indicating that they will review requests for modifications and if unable to grant request will assist in determining a transportation company that would be able to.   CSW will check back in 3 weeks to ensure that process is moving along for Medicaid and that transportation has been arranged prior to 02/01/17 appointment with patient's PCP.    Lincoln MaxinKelly Dionisio Aragones, LCSW [facility]  Clinical Social Worker cell #: [phone]

## 2016-12-24 DIAGNOSIS — R419 Unspecified symptoms and signs involving cognitive functions and awareness: Secondary | ICD-10-CM | POA: Diagnosis not present

## 2016-12-24 DIAGNOSIS — Z7982 Long term (current) use of aspirin: Secondary | ICD-10-CM | POA: Diagnosis not present

## 2016-12-24 DIAGNOSIS — E1151 Type 2 diabetes mellitus with diabetic peripheral angiopathy without gangrene: Secondary | ICD-10-CM | POA: Diagnosis not present

## 2016-12-24 DIAGNOSIS — M6281 Muscle weakness (generalized): Secondary | ICD-10-CM | POA: Diagnosis not present

## 2016-12-24 DIAGNOSIS — I10 Essential (primary) hypertension: Secondary | ICD-10-CM | POA: Diagnosis not present

## 2016-12-24 DIAGNOSIS — A0472 Enterocolitis due to Clostridium difficile, not specified as recurrent: Secondary | ICD-10-CM | POA: Diagnosis not present

## 2016-12-24 DIAGNOSIS — E039 Hypothyroidism, unspecified: Secondary | ICD-10-CM | POA: Diagnosis not present

## 2016-12-24 DIAGNOSIS — Z7984 Long term (current) use of oral hypoglycemic drugs: Secondary | ICD-10-CM | POA: Diagnosis not present

## 2016-12-24 DIAGNOSIS — K219 Gastro-esophageal reflux disease without esophagitis: Secondary | ICD-10-CM | POA: Diagnosis not present

## 2016-12-26 DIAGNOSIS — I10 Essential (primary) hypertension: Secondary | ICD-10-CM | POA: Diagnosis not present

## 2016-12-26 DIAGNOSIS — E039 Hypothyroidism, unspecified: Secondary | ICD-10-CM | POA: Diagnosis not present

## 2016-12-26 DIAGNOSIS — A0472 Enterocolitis due to Clostridium difficile, not specified as recurrent: Secondary | ICD-10-CM | POA: Diagnosis not present

## 2016-12-26 DIAGNOSIS — Z7984 Long term (current) use of oral hypoglycemic drugs: Secondary | ICD-10-CM | POA: Diagnosis not present

## 2016-12-26 DIAGNOSIS — M6281 Muscle weakness (generalized): Secondary | ICD-10-CM | POA: Diagnosis not present

## 2016-12-26 DIAGNOSIS — R419 Unspecified symptoms and signs involving cognitive functions and awareness: Secondary | ICD-10-CM | POA: Diagnosis not present

## 2016-12-26 DIAGNOSIS — E1151 Type 2 diabetes mellitus with diabetic peripheral angiopathy without gangrene: Secondary | ICD-10-CM | POA: Diagnosis not present

## 2016-12-26 DIAGNOSIS — Z7982 Long term (current) use of aspirin: Secondary | ICD-10-CM | POA: Diagnosis not present

## 2016-12-26 DIAGNOSIS — K219 Gastro-esophageal reflux disease without esophagitis: Secondary | ICD-10-CM | POA: Diagnosis not present

## 2016-12-31 DIAGNOSIS — R419 Unspecified symptoms and signs involving cognitive functions and awareness: Secondary | ICD-10-CM | POA: Diagnosis not present

## 2016-12-31 DIAGNOSIS — Z7984 Long term (current) use of oral hypoglycemic drugs: Secondary | ICD-10-CM | POA: Diagnosis not present

## 2016-12-31 DIAGNOSIS — I10 Essential (primary) hypertension: Secondary | ICD-10-CM | POA: Diagnosis not present

## 2016-12-31 DIAGNOSIS — E039 Hypothyroidism, unspecified: Secondary | ICD-10-CM | POA: Diagnosis not present

## 2016-12-31 DIAGNOSIS — M6281 Muscle weakness (generalized): Secondary | ICD-10-CM | POA: Diagnosis not present

## 2016-12-31 DIAGNOSIS — Z7982 Long term (current) use of aspirin: Secondary | ICD-10-CM | POA: Diagnosis not present

## 2016-12-31 DIAGNOSIS — K219 Gastro-esophageal reflux disease without esophagitis: Secondary | ICD-10-CM | POA: Diagnosis not present

## 2016-12-31 DIAGNOSIS — A0472 Enterocolitis due to Clostridium difficile, not specified as recurrent: Secondary | ICD-10-CM | POA: Diagnosis not present

## 2016-12-31 DIAGNOSIS — E1151 Type 2 diabetes mellitus with diabetic peripheral angiopathy without gangrene: Secondary | ICD-10-CM | POA: Diagnosis not present

## 2017-01-09 ENCOUNTER — Other Ambulatory Visit: Payer: Self-pay | Admitting: *Deleted

## 2017-01-09 NOTE — Patient Outreach (Signed)
[facility]) Care Management  01/09/2017  Lyla SonOpalene Mcclaren 06/01/1924 [id number]   CSW attempted to reach patient/daughter, Steward DroneBrenda but no answer. CSW left HIPPA compliant voicemail, will await return call or schedule for second attempt to call next week.    Lincoln MaxinKelly Maya Arcand, LCSW [facility]  Clinical Social Worker cell #: [phone]

## 2017-01-16 ENCOUNTER — Other Ambulatory Visit: Payer: Self-pay | Admitting: *Deleted

## 2017-01-19 ENCOUNTER — Other Ambulatory Visit: Payer: Self-pay | Admitting: *Deleted

## 2017-01-29 DIAGNOSIS — E1151 Type 2 diabetes mellitus with diabetic peripheral angiopathy without gangrene: Secondary | ICD-10-CM | POA: Diagnosis not present

## 2017-01-29 DIAGNOSIS — M6281 Muscle weakness (generalized): Secondary | ICD-10-CM | POA: Diagnosis not present

## 2017-01-29 DIAGNOSIS — I10 Essential (primary) hypertension: Secondary | ICD-10-CM | POA: Diagnosis not present

## 2017-01-29 DIAGNOSIS — A0472 Enterocolitis due to Clostridium difficile, not specified as recurrent: Secondary | ICD-10-CM | POA: Diagnosis not present

## 2017-01-30 ENCOUNTER — Other Ambulatory Visit: Payer: Self-pay | Admitting: *Deleted

## 2017-01-30 NOTE — Patient Outreach (Signed)
[facility]) Care Management  01/30/2017  Lyla SonOpalene Memon 10/18/1923 [id number]   CSW made a second attempt to try and contact patient today to perform phone assessment, as well as assess and assist with social needs and services, without success. A HIPPA compliant message was left for patient on voicemail (ph#: [phone]), CSW attempted to reach patient's daughter, Steward DroneBrenda on her cell [phone]) but no answer & could not leave a message. CSW will have unsuccessful outreach letter mailed to patient. CSW is currently awaiting a return call. CSW will make a third outreach attempt within the next week, if CSW does not receive a return call from patient in the meantime.    Lincoln MaxinKelly Lewanda Perea, LCSW [facility]  Clinical Social Worker cell #: [phone]

## 2017-02-04 ENCOUNTER — Other Ambulatory Visit: Payer: Self-pay | Admitting: *Deleted

## 2017-02-04 NOTE — Patient Outreach (Signed)
[facility]) Care Management  02/04/2017  Sonya Barnett 07/04/1924 [id number]   CSW made a third and final attempt to try and contact patient today to perform phone assessment as well as assess and assist with social work needs and services, without success. A HIPPA compliant message was left for patient on voicemail (ph#: [phone]). CSW continues to await return call. CSW had mailed an outreach letter to patient's home, encouraging patient to contact CSW at their earliest convenience, if patient is interested in continuing to receive social work services through CSW with [facility]. If CSW does not receive a return call from patient within the next 10 business days, CSW will proceed with case closure. Required number of phone attempts will have been made and outreach letter mailed.    Lincoln MaxinKelly Sincere Liuzzi, LCSW [facility]  Clinical Social Worker cell #: [phone]

## 2017-02-11 DIAGNOSIS — E782 Mixed hyperlipidemia: Secondary | ICD-10-CM | POA: Diagnosis not present

## 2017-02-11 DIAGNOSIS — R296 Repeated falls: Secondary | ICD-10-CM | POA: Diagnosis not present

## 2017-02-11 DIAGNOSIS — D631 Anemia in chronic kidney disease: Secondary | ICD-10-CM | POA: Diagnosis not present

## 2017-02-11 DIAGNOSIS — E1165 Type 2 diabetes mellitus with hyperglycemia: Secondary | ICD-10-CM | POA: Diagnosis not present

## 2017-02-11 DIAGNOSIS — N183 Chronic kidney disease, stage 3 (moderate): Secondary | ICD-10-CM | POA: Diagnosis not present

## 2017-02-11 DIAGNOSIS — I10 Essential (primary) hypertension: Secondary | ICD-10-CM | POA: Diagnosis not present

## 2017-02-13 ENCOUNTER — Other Ambulatory Visit: Payer: Self-pay | Admitting: *Deleted

## 2017-02-13 DIAGNOSIS — E119 Type 2 diabetes mellitus without complications: Secondary | ICD-10-CM | POA: Diagnosis not present

## 2017-02-13 DIAGNOSIS — Z7984 Long term (current) use of oral hypoglycemic drugs: Secondary | ICD-10-CM | POA: Diagnosis not present

## 2017-02-13 DIAGNOSIS — R2689 Other abnormalities of gait and mobility: Secondary | ICD-10-CM | POA: Diagnosis not present

## 2017-02-13 DIAGNOSIS — E039 Hypothyroidism, unspecified: Secondary | ICD-10-CM | POA: Diagnosis not present

## 2017-02-13 DIAGNOSIS — I110 Hypertensive heart disease with heart failure: Secondary | ICD-10-CM | POA: Diagnosis not present

## 2017-02-13 DIAGNOSIS — Z5181 Encounter for therapeutic drug level monitoring: Secondary | ICD-10-CM | POA: Diagnosis not present

## 2017-02-13 DIAGNOSIS — I509 Heart failure, unspecified: Secondary | ICD-10-CM | POA: Diagnosis not present

## 2017-02-13 DIAGNOSIS — Z7982 Long term (current) use of aspirin: Secondary | ICD-10-CM | POA: Diagnosis not present

## 2017-02-13 NOTE — Patient Outreach (Signed)
[facility]) Care Management  02/13/2017  Lyla SonOpalene Pagliarulo 11/19/1923 [id number]   CSW will perform a case closure on patient, as CSW has not been able to get in contact with patient after 3 attempts & messages left for patient/daughter on their home #: [phone].   CSW will fax an update to patient's Primary Care Physician, Dr. Fara ChutePaul Sasser to ensure that they are aware of CSW's involvement with patient's plan of care. CSW will submit a case closure request to Tomasita CrumbleLavelda Comer, Care Management Assistant with [facility] in the form of an inbasket message.    Lincoln MaxinKelly Samar Dass, LCSW [facility]  Clinical Social Worker cell #: [phone]

## 2017-02-16 DIAGNOSIS — Z7984 Long term (current) use of oral hypoglycemic drugs: Secondary | ICD-10-CM | POA: Diagnosis not present

## 2017-02-16 DIAGNOSIS — Z7982 Long term (current) use of aspirin: Secondary | ICD-10-CM | POA: Diagnosis not present

## 2017-02-16 DIAGNOSIS — E1151 Type 2 diabetes mellitus with diabetic peripheral angiopathy without gangrene: Secondary | ICD-10-CM | POA: Diagnosis not present

## 2017-02-16 DIAGNOSIS — I509 Heart failure, unspecified: Secondary | ICD-10-CM | POA: Diagnosis not present

## 2017-02-16 DIAGNOSIS — D649 Anemia, unspecified: Secondary | ICD-10-CM | POA: Diagnosis not present

## 2017-02-16 DIAGNOSIS — R2689 Other abnormalities of gait and mobility: Secondary | ICD-10-CM | POA: Diagnosis not present

## 2017-02-16 DIAGNOSIS — E119 Type 2 diabetes mellitus without complications: Secondary | ICD-10-CM | POA: Diagnosis not present

## 2017-02-16 DIAGNOSIS — E039 Hypothyroidism, unspecified: Secondary | ICD-10-CM | POA: Diagnosis not present

## 2017-02-16 DIAGNOSIS — Z5181 Encounter for therapeutic drug level monitoring: Secondary | ICD-10-CM | POA: Diagnosis not present

## 2017-02-16 DIAGNOSIS — I110 Hypertensive heart disease with heart failure: Secondary | ICD-10-CM | POA: Diagnosis not present

## 2017-02-18 DIAGNOSIS — Z7984 Long term (current) use of oral hypoglycemic drugs: Secondary | ICD-10-CM | POA: Diagnosis not present

## 2017-02-18 DIAGNOSIS — Z5181 Encounter for therapeutic drug level monitoring: Secondary | ICD-10-CM | POA: Diagnosis not present

## 2017-02-18 DIAGNOSIS — E119 Type 2 diabetes mellitus without complications: Secondary | ICD-10-CM | POA: Diagnosis not present

## 2017-02-18 DIAGNOSIS — Z7982 Long term (current) use of aspirin: Secondary | ICD-10-CM | POA: Diagnosis not present

## 2017-02-18 DIAGNOSIS — R2689 Other abnormalities of gait and mobility: Secondary | ICD-10-CM | POA: Diagnosis not present

## 2017-02-18 DIAGNOSIS — E039 Hypothyroidism, unspecified: Secondary | ICD-10-CM | POA: Diagnosis not present

## 2017-02-18 DIAGNOSIS — I509 Heart failure, unspecified: Secondary | ICD-10-CM | POA: Diagnosis not present

## 2017-02-18 DIAGNOSIS — I110 Hypertensive heart disease with heart failure: Secondary | ICD-10-CM | POA: Diagnosis not present

## 2017-02-19 DIAGNOSIS — R2689 Other abnormalities of gait and mobility: Secondary | ICD-10-CM | POA: Diagnosis not present

## 2017-02-19 DIAGNOSIS — E119 Type 2 diabetes mellitus without complications: Secondary | ICD-10-CM | POA: Diagnosis not present

## 2017-02-19 DIAGNOSIS — Z7982 Long term (current) use of aspirin: Secondary | ICD-10-CM | POA: Diagnosis not present

## 2017-02-19 DIAGNOSIS — Z5181 Encounter for therapeutic drug level monitoring: Secondary | ICD-10-CM | POA: Diagnosis not present

## 2017-02-19 DIAGNOSIS — Z7984 Long term (current) use of oral hypoglycemic drugs: Secondary | ICD-10-CM | POA: Diagnosis not present

## 2017-02-19 DIAGNOSIS — I509 Heart failure, unspecified: Secondary | ICD-10-CM | POA: Diagnosis not present

## 2017-02-19 DIAGNOSIS — I110 Hypertensive heart disease with heart failure: Secondary | ICD-10-CM | POA: Diagnosis not present

## 2017-02-19 DIAGNOSIS — E039 Hypothyroidism, unspecified: Secondary | ICD-10-CM | POA: Diagnosis not present

## 2017-02-20 DIAGNOSIS — Z5181 Encounter for therapeutic drug level monitoring: Secondary | ICD-10-CM | POA: Diagnosis not present

## 2017-02-20 DIAGNOSIS — Z7984 Long term (current) use of oral hypoglycemic drugs: Secondary | ICD-10-CM | POA: Diagnosis not present

## 2017-02-20 DIAGNOSIS — I509 Heart failure, unspecified: Secondary | ICD-10-CM | POA: Diagnosis not present

## 2017-02-20 DIAGNOSIS — E119 Type 2 diabetes mellitus without complications: Secondary | ICD-10-CM | POA: Diagnosis not present

## 2017-02-20 DIAGNOSIS — Z7982 Long term (current) use of aspirin: Secondary | ICD-10-CM | POA: Diagnosis not present

## 2017-02-20 DIAGNOSIS — E039 Hypothyroidism, unspecified: Secondary | ICD-10-CM | POA: Diagnosis not present

## 2017-02-20 DIAGNOSIS — I110 Hypertensive heart disease with heart failure: Secondary | ICD-10-CM | POA: Diagnosis not present

## 2017-02-20 DIAGNOSIS — R2689 Other abnormalities of gait and mobility: Secondary | ICD-10-CM | POA: Diagnosis not present

## 2017-02-23 DIAGNOSIS — E119 Type 2 diabetes mellitus without complications: Secondary | ICD-10-CM | POA: Diagnosis not present

## 2017-02-23 DIAGNOSIS — Z7984 Long term (current) use of oral hypoglycemic drugs: Secondary | ICD-10-CM | POA: Diagnosis not present

## 2017-02-23 DIAGNOSIS — I509 Heart failure, unspecified: Secondary | ICD-10-CM | POA: Diagnosis not present

## 2017-02-23 DIAGNOSIS — R2689 Other abnormalities of gait and mobility: Secondary | ICD-10-CM | POA: Diagnosis not present

## 2017-02-23 DIAGNOSIS — Z5181 Encounter for therapeutic drug level monitoring: Secondary | ICD-10-CM | POA: Diagnosis not present

## 2017-02-23 DIAGNOSIS — I110 Hypertensive heart disease with heart failure: Secondary | ICD-10-CM | POA: Diagnosis not present

## 2017-02-23 DIAGNOSIS — Z7982 Long term (current) use of aspirin: Secondary | ICD-10-CM | POA: Diagnosis not present

## 2017-02-23 DIAGNOSIS — E039 Hypothyroidism, unspecified: Secondary | ICD-10-CM | POA: Diagnosis not present

## 2017-02-24 DIAGNOSIS — R2689 Other abnormalities of gait and mobility: Secondary | ICD-10-CM | POA: Diagnosis not present

## 2017-02-24 DIAGNOSIS — Z7984 Long term (current) use of oral hypoglycemic drugs: Secondary | ICD-10-CM | POA: Diagnosis not present

## 2017-02-24 DIAGNOSIS — E039 Hypothyroidism, unspecified: Secondary | ICD-10-CM | POA: Diagnosis not present

## 2017-02-24 DIAGNOSIS — Z7982 Long term (current) use of aspirin: Secondary | ICD-10-CM | POA: Diagnosis not present

## 2017-02-24 DIAGNOSIS — I509 Heart failure, unspecified: Secondary | ICD-10-CM | POA: Diagnosis not present

## 2017-02-24 DIAGNOSIS — Z5181 Encounter for therapeutic drug level monitoring: Secondary | ICD-10-CM | POA: Diagnosis not present

## 2017-02-24 DIAGNOSIS — E119 Type 2 diabetes mellitus without complications: Secondary | ICD-10-CM | POA: Diagnosis not present

## 2017-02-24 DIAGNOSIS — I110 Hypertensive heart disease with heart failure: Secondary | ICD-10-CM | POA: Diagnosis not present

## 2017-02-25 DIAGNOSIS — Z7982 Long term (current) use of aspirin: Secondary | ICD-10-CM | POA: Diagnosis not present

## 2017-02-25 DIAGNOSIS — I509 Heart failure, unspecified: Secondary | ICD-10-CM | POA: Diagnosis not present

## 2017-02-25 DIAGNOSIS — I110 Hypertensive heart disease with heart failure: Secondary | ICD-10-CM | POA: Diagnosis not present

## 2017-02-25 DIAGNOSIS — E039 Hypothyroidism, unspecified: Secondary | ICD-10-CM | POA: Diagnosis not present

## 2017-02-25 DIAGNOSIS — R2689 Other abnormalities of gait and mobility: Secondary | ICD-10-CM | POA: Diagnosis not present

## 2017-02-25 DIAGNOSIS — Z7984 Long term (current) use of oral hypoglycemic drugs: Secondary | ICD-10-CM | POA: Diagnosis not present

## 2017-02-25 DIAGNOSIS — E119 Type 2 diabetes mellitus without complications: Secondary | ICD-10-CM | POA: Diagnosis not present

## 2017-02-25 DIAGNOSIS — Z5181 Encounter for therapeutic drug level monitoring: Secondary | ICD-10-CM | POA: Diagnosis not present

## 2017-02-26 DIAGNOSIS — R2689 Other abnormalities of gait and mobility: Secondary | ICD-10-CM | POA: Diagnosis not present

## 2017-02-26 DIAGNOSIS — I110 Hypertensive heart disease with heart failure: Secondary | ICD-10-CM | POA: Diagnosis not present

## 2017-02-26 DIAGNOSIS — E119 Type 2 diabetes mellitus without complications: Secondary | ICD-10-CM | POA: Diagnosis not present

## 2017-02-26 DIAGNOSIS — I509 Heart failure, unspecified: Secondary | ICD-10-CM | POA: Diagnosis not present

## 2017-02-26 DIAGNOSIS — R269 Unspecified abnormalities of gait and mobility: Secondary | ICD-10-CM | POA: Diagnosis not present

## 2017-02-27 DIAGNOSIS — E119 Type 2 diabetes mellitus without complications: Secondary | ICD-10-CM | POA: Diagnosis not present

## 2017-02-27 DIAGNOSIS — I110 Hypertensive heart disease with heart failure: Secondary | ICD-10-CM | POA: Diagnosis not present

## 2017-02-27 DIAGNOSIS — Z7984 Long term (current) use of oral hypoglycemic drugs: Secondary | ICD-10-CM | POA: Diagnosis not present

## 2017-02-27 DIAGNOSIS — I509 Heart failure, unspecified: Secondary | ICD-10-CM | POA: Diagnosis not present

## 2017-02-27 DIAGNOSIS — R2689 Other abnormalities of gait and mobility: Secondary | ICD-10-CM | POA: Diagnosis not present

## 2017-02-27 DIAGNOSIS — Z7982 Long term (current) use of aspirin: Secondary | ICD-10-CM | POA: Diagnosis not present

## 2017-02-27 DIAGNOSIS — Z5181 Encounter for therapeutic drug level monitoring: Secondary | ICD-10-CM | POA: Diagnosis not present

## 2017-02-27 DIAGNOSIS — E039 Hypothyroidism, unspecified: Secondary | ICD-10-CM | POA: Diagnosis not present

## 2017-03-02 DIAGNOSIS — E039 Hypothyroidism, unspecified: Secondary | ICD-10-CM | POA: Diagnosis not present

## 2017-03-02 DIAGNOSIS — Z7982 Long term (current) use of aspirin: Secondary | ICD-10-CM | POA: Diagnosis not present

## 2017-03-02 DIAGNOSIS — Z5181 Encounter for therapeutic drug level monitoring: Secondary | ICD-10-CM | POA: Diagnosis not present

## 2017-03-02 DIAGNOSIS — R2689 Other abnormalities of gait and mobility: Secondary | ICD-10-CM | POA: Diagnosis not present

## 2017-03-02 DIAGNOSIS — E119 Type 2 diabetes mellitus without complications: Secondary | ICD-10-CM | POA: Diagnosis not present

## 2017-03-02 DIAGNOSIS — Z7984 Long term (current) use of oral hypoglycemic drugs: Secondary | ICD-10-CM | POA: Diagnosis not present

## 2017-03-02 DIAGNOSIS — I509 Heart failure, unspecified: Secondary | ICD-10-CM | POA: Diagnosis not present

## 2017-03-02 DIAGNOSIS — I110 Hypertensive heart disease with heart failure: Secondary | ICD-10-CM | POA: Diagnosis not present

## 2017-03-04 DIAGNOSIS — R2689 Other abnormalities of gait and mobility: Secondary | ICD-10-CM | POA: Diagnosis not present

## 2017-03-04 DIAGNOSIS — E119 Type 2 diabetes mellitus without complications: Secondary | ICD-10-CM | POA: Diagnosis not present

## 2017-03-04 DIAGNOSIS — Z5181 Encounter for therapeutic drug level monitoring: Secondary | ICD-10-CM | POA: Diagnosis not present

## 2017-03-04 DIAGNOSIS — I110 Hypertensive heart disease with heart failure: Secondary | ICD-10-CM | POA: Diagnosis not present

## 2017-03-04 DIAGNOSIS — E039 Hypothyroidism, unspecified: Secondary | ICD-10-CM | POA: Diagnosis not present

## 2017-03-04 DIAGNOSIS — I509 Heart failure, unspecified: Secondary | ICD-10-CM | POA: Diagnosis not present

## 2017-03-04 DIAGNOSIS — Z7984 Long term (current) use of oral hypoglycemic drugs: Secondary | ICD-10-CM | POA: Diagnosis not present

## 2017-03-04 DIAGNOSIS — Z7982 Long term (current) use of aspirin: Secondary | ICD-10-CM | POA: Diagnosis not present

## 2017-03-06 DIAGNOSIS — Z7982 Long term (current) use of aspirin: Secondary | ICD-10-CM | POA: Diagnosis not present

## 2017-03-06 DIAGNOSIS — I509 Heart failure, unspecified: Secondary | ICD-10-CM | POA: Diagnosis not present

## 2017-03-06 DIAGNOSIS — E119 Type 2 diabetes mellitus without complications: Secondary | ICD-10-CM | POA: Diagnosis not present

## 2017-03-06 DIAGNOSIS — Z7984 Long term (current) use of oral hypoglycemic drugs: Secondary | ICD-10-CM | POA: Diagnosis not present

## 2017-03-06 DIAGNOSIS — Z5181 Encounter for therapeutic drug level monitoring: Secondary | ICD-10-CM | POA: Diagnosis not present

## 2017-03-06 DIAGNOSIS — R2689 Other abnormalities of gait and mobility: Secondary | ICD-10-CM | POA: Diagnosis not present

## 2017-03-06 DIAGNOSIS — E039 Hypothyroidism, unspecified: Secondary | ICD-10-CM | POA: Diagnosis not present

## 2017-03-06 DIAGNOSIS — I110 Hypertensive heart disease with heart failure: Secondary | ICD-10-CM | POA: Diagnosis not present

## 2017-03-10 DIAGNOSIS — Z7982 Long term (current) use of aspirin: Secondary | ICD-10-CM | POA: Diagnosis not present

## 2017-03-10 DIAGNOSIS — Z5181 Encounter for therapeutic drug level monitoring: Secondary | ICD-10-CM | POA: Diagnosis not present

## 2017-03-10 DIAGNOSIS — Z7984 Long term (current) use of oral hypoglycemic drugs: Secondary | ICD-10-CM | POA: Diagnosis not present

## 2017-03-10 DIAGNOSIS — E119 Type 2 diabetes mellitus without complications: Secondary | ICD-10-CM | POA: Diagnosis not present

## 2017-03-10 DIAGNOSIS — E039 Hypothyroidism, unspecified: Secondary | ICD-10-CM | POA: Diagnosis not present

## 2017-03-10 DIAGNOSIS — I509 Heart failure, unspecified: Secondary | ICD-10-CM | POA: Diagnosis not present

## 2017-03-10 DIAGNOSIS — R2689 Other abnormalities of gait and mobility: Secondary | ICD-10-CM | POA: Diagnosis not present

## 2017-03-10 DIAGNOSIS — I110 Hypertensive heart disease with heart failure: Secondary | ICD-10-CM | POA: Diagnosis not present

## 2017-03-23 DIAGNOSIS — I509 Heart failure, unspecified: Secondary | ICD-10-CM | POA: Diagnosis not present

## 2017-03-23 DIAGNOSIS — E119 Type 2 diabetes mellitus without complications: Secondary | ICD-10-CM | POA: Diagnosis not present

## 2017-03-23 DIAGNOSIS — E039 Hypothyroidism, unspecified: Secondary | ICD-10-CM | POA: Diagnosis not present

## 2017-03-23 DIAGNOSIS — Z7984 Long term (current) use of oral hypoglycemic drugs: Secondary | ICD-10-CM | POA: Diagnosis not present

## 2017-03-23 DIAGNOSIS — I110 Hypertensive heart disease with heart failure: Secondary | ICD-10-CM | POA: Diagnosis not present

## 2017-03-23 DIAGNOSIS — Z5181 Encounter for therapeutic drug level monitoring: Secondary | ICD-10-CM | POA: Diagnosis not present

## 2017-03-23 DIAGNOSIS — R2689 Other abnormalities of gait and mobility: Secondary | ICD-10-CM | POA: Diagnosis not present

## 2017-03-23 DIAGNOSIS — Z7982 Long term (current) use of aspirin: Secondary | ICD-10-CM | POA: Diagnosis not present

## 2017-03-29 DIAGNOSIS — R269 Unspecified abnormalities of gait and mobility: Secondary | ICD-10-CM | POA: Diagnosis not present

## 2017-03-31 DIAGNOSIS — Z5181 Encounter for therapeutic drug level monitoring: Secondary | ICD-10-CM | POA: Diagnosis not present

## 2017-03-31 DIAGNOSIS — I509 Heart failure, unspecified: Secondary | ICD-10-CM | POA: Diagnosis not present

## 2017-03-31 DIAGNOSIS — E119 Type 2 diabetes mellitus without complications: Secondary | ICD-10-CM | POA: Diagnosis not present

## 2017-03-31 DIAGNOSIS — Z7982 Long term (current) use of aspirin: Secondary | ICD-10-CM | POA: Diagnosis not present

## 2017-03-31 DIAGNOSIS — I110 Hypertensive heart disease with heart failure: Secondary | ICD-10-CM | POA: Diagnosis not present

## 2017-03-31 DIAGNOSIS — R2689 Other abnormalities of gait and mobility: Secondary | ICD-10-CM | POA: Diagnosis not present

## 2017-03-31 DIAGNOSIS — Z7984 Long term (current) use of oral hypoglycemic drugs: Secondary | ICD-10-CM | POA: Diagnosis not present

## 2017-03-31 DIAGNOSIS — E039 Hypothyroidism, unspecified: Secondary | ICD-10-CM | POA: Diagnosis not present

## 2017-04-28 DIAGNOSIS — R269 Unspecified abnormalities of gait and mobility: Secondary | ICD-10-CM | POA: Diagnosis not present

## 2017-05-29 DIAGNOSIS — R269 Unspecified abnormalities of gait and mobility: Secondary | ICD-10-CM | POA: Diagnosis not present

## 2017-06-28 DIAGNOSIS — R269 Unspecified abnormalities of gait and mobility: Secondary | ICD-10-CM | POA: Diagnosis not present

## 2017-07-25 DIAGNOSIS — L89510 Pressure ulcer of right ankle, unstageable: Secondary | ICD-10-CM | POA: Diagnosis not present

## 2017-07-29 DIAGNOSIS — M549 Dorsalgia, unspecified: Secondary | ICD-10-CM | POA: Diagnosis not present

## 2017-07-29 DIAGNOSIS — E1122 Type 2 diabetes mellitus with diabetic chronic kidney disease: Secondary | ICD-10-CM | POA: Diagnosis not present

## 2017-07-29 DIAGNOSIS — E782 Mixed hyperlipidemia: Secondary | ICD-10-CM | POA: Diagnosis not present

## 2017-07-29 DIAGNOSIS — L89610 Pressure ulcer of right heel, unstageable: Secondary | ICD-10-CM | POA: Diagnosis not present

## 2017-07-29 DIAGNOSIS — Z7984 Long term (current) use of oral hypoglycemic drugs: Secondary | ICD-10-CM | POA: Diagnosis not present

## 2017-07-29 DIAGNOSIS — R269 Unspecified abnormalities of gait and mobility: Secondary | ICD-10-CM | POA: Diagnosis not present

## 2017-07-29 DIAGNOSIS — N183 Chronic kidney disease, stage 3 (moderate): Secondary | ICD-10-CM | POA: Diagnosis not present

## 2017-07-29 DIAGNOSIS — E039 Hypothyroidism, unspecified: Secondary | ICD-10-CM | POA: Diagnosis not present

## 2017-07-29 DIAGNOSIS — D631 Anemia in chronic kidney disease: Secondary | ICD-10-CM | POA: Diagnosis not present

## 2017-07-29 DIAGNOSIS — I129 Hypertensive chronic kidney disease with stage 1 through stage 4 chronic kidney disease, or unspecified chronic kidney disease: Secondary | ICD-10-CM | POA: Diagnosis not present

## 2017-07-31 DIAGNOSIS — E1122 Type 2 diabetes mellitus with diabetic chronic kidney disease: Secondary | ICD-10-CM | POA: Diagnosis not present

## 2017-07-31 DIAGNOSIS — D631 Anemia in chronic kidney disease: Secondary | ICD-10-CM | POA: Diagnosis not present

## 2017-07-31 DIAGNOSIS — N183 Chronic kidney disease, stage 3 (moderate): Secondary | ICD-10-CM | POA: Diagnosis not present

## 2017-07-31 DIAGNOSIS — L89610 Pressure ulcer of right heel, unstageable: Secondary | ICD-10-CM | POA: Diagnosis not present

## 2017-07-31 DIAGNOSIS — M549 Dorsalgia, unspecified: Secondary | ICD-10-CM | POA: Diagnosis not present

## 2017-07-31 DIAGNOSIS — E782 Mixed hyperlipidemia: Secondary | ICD-10-CM | POA: Diagnosis not present

## 2017-07-31 DIAGNOSIS — Z7984 Long term (current) use of oral hypoglycemic drugs: Secondary | ICD-10-CM | POA: Diagnosis not present

## 2017-07-31 DIAGNOSIS — E039 Hypothyroidism, unspecified: Secondary | ICD-10-CM | POA: Diagnosis not present

## 2017-07-31 DIAGNOSIS — I129 Hypertensive chronic kidney disease with stage 1 through stage 4 chronic kidney disease, or unspecified chronic kidney disease: Secondary | ICD-10-CM | POA: Diagnosis not present

## 2017-08-04 DIAGNOSIS — L89610 Pressure ulcer of right heel, unstageable: Secondary | ICD-10-CM | POA: Diagnosis not present

## 2017-08-04 DIAGNOSIS — N183 Chronic kidney disease, stage 3 (moderate): Secondary | ICD-10-CM | POA: Diagnosis not present

## 2017-08-04 DIAGNOSIS — M549 Dorsalgia, unspecified: Secondary | ICD-10-CM | POA: Diagnosis not present

## 2017-08-04 DIAGNOSIS — E782 Mixed hyperlipidemia: Secondary | ICD-10-CM | POA: Diagnosis not present

## 2017-08-04 DIAGNOSIS — E039 Hypothyroidism, unspecified: Secondary | ICD-10-CM | POA: Diagnosis not present

## 2017-08-04 DIAGNOSIS — Z7984 Long term (current) use of oral hypoglycemic drugs: Secondary | ICD-10-CM | POA: Diagnosis not present

## 2017-08-04 DIAGNOSIS — I129 Hypertensive chronic kidney disease with stage 1 through stage 4 chronic kidney disease, or unspecified chronic kidney disease: Secondary | ICD-10-CM | POA: Diagnosis not present

## 2017-08-04 DIAGNOSIS — D631 Anemia in chronic kidney disease: Secondary | ICD-10-CM | POA: Diagnosis not present

## 2017-08-04 DIAGNOSIS — E1122 Type 2 diabetes mellitus with diabetic chronic kidney disease: Secondary | ICD-10-CM | POA: Diagnosis not present

## 2017-08-06 DIAGNOSIS — E782 Mixed hyperlipidemia: Secondary | ICD-10-CM | POA: Diagnosis not present

## 2017-08-06 DIAGNOSIS — E039 Hypothyroidism, unspecified: Secondary | ICD-10-CM | POA: Diagnosis not present

## 2017-08-06 DIAGNOSIS — N183 Chronic kidney disease, stage 3 (moderate): Secondary | ICD-10-CM | POA: Diagnosis not present

## 2017-08-06 DIAGNOSIS — Z7984 Long term (current) use of oral hypoglycemic drugs: Secondary | ICD-10-CM | POA: Diagnosis not present

## 2017-08-06 DIAGNOSIS — D631 Anemia in chronic kidney disease: Secondary | ICD-10-CM | POA: Diagnosis not present

## 2017-08-06 DIAGNOSIS — E1122 Type 2 diabetes mellitus with diabetic chronic kidney disease: Secondary | ICD-10-CM | POA: Diagnosis not present

## 2017-08-06 DIAGNOSIS — M549 Dorsalgia, unspecified: Secondary | ICD-10-CM | POA: Diagnosis not present

## 2017-08-06 DIAGNOSIS — L89610 Pressure ulcer of right heel, unstageable: Secondary | ICD-10-CM | POA: Diagnosis not present

## 2017-08-06 DIAGNOSIS — I129 Hypertensive chronic kidney disease with stage 1 through stage 4 chronic kidney disease, or unspecified chronic kidney disease: Secondary | ICD-10-CM | POA: Diagnosis not present

## 2017-08-14 DIAGNOSIS — D631 Anemia in chronic kidney disease: Secondary | ICD-10-CM | POA: Diagnosis not present

## 2017-08-14 DIAGNOSIS — N183 Chronic kidney disease, stage 3 (moderate): Secondary | ICD-10-CM | POA: Diagnosis not present

## 2017-08-14 DIAGNOSIS — L89610 Pressure ulcer of right heel, unstageable: Secondary | ICD-10-CM | POA: Diagnosis not present

## 2017-08-14 DIAGNOSIS — E039 Hypothyroidism, unspecified: Secondary | ICD-10-CM | POA: Diagnosis not present

## 2017-08-14 DIAGNOSIS — Z7984 Long term (current) use of oral hypoglycemic drugs: Secondary | ICD-10-CM | POA: Diagnosis not present

## 2017-08-14 DIAGNOSIS — E782 Mixed hyperlipidemia: Secondary | ICD-10-CM | POA: Diagnosis not present

## 2017-08-14 DIAGNOSIS — M549 Dorsalgia, unspecified: Secondary | ICD-10-CM | POA: Diagnosis not present

## 2017-08-14 DIAGNOSIS — E1122 Type 2 diabetes mellitus with diabetic chronic kidney disease: Secondary | ICD-10-CM | POA: Diagnosis not present

## 2017-08-14 DIAGNOSIS — I129 Hypertensive chronic kidney disease with stage 1 through stage 4 chronic kidney disease, or unspecified chronic kidney disease: Secondary | ICD-10-CM | POA: Diagnosis not present

## 2017-08-19 DIAGNOSIS — D631 Anemia in chronic kidney disease: Secondary | ICD-10-CM | POA: Diagnosis not present

## 2017-08-19 DIAGNOSIS — Z7984 Long term (current) use of oral hypoglycemic drugs: Secondary | ICD-10-CM | POA: Diagnosis not present

## 2017-08-19 DIAGNOSIS — M549 Dorsalgia, unspecified: Secondary | ICD-10-CM | POA: Diagnosis not present

## 2017-08-19 DIAGNOSIS — L89610 Pressure ulcer of right heel, unstageable: Secondary | ICD-10-CM | POA: Diagnosis not present

## 2017-08-19 DIAGNOSIS — N183 Chronic kidney disease, stage 3 (moderate): Secondary | ICD-10-CM | POA: Diagnosis not present

## 2017-08-19 DIAGNOSIS — I129 Hypertensive chronic kidney disease with stage 1 through stage 4 chronic kidney disease, or unspecified chronic kidney disease: Secondary | ICD-10-CM | POA: Diagnosis not present

## 2017-08-19 DIAGNOSIS — E782 Mixed hyperlipidemia: Secondary | ICD-10-CM | POA: Diagnosis not present

## 2017-08-19 DIAGNOSIS — E1122 Type 2 diabetes mellitus with diabetic chronic kidney disease: Secondary | ICD-10-CM | POA: Diagnosis not present

## 2017-08-19 DIAGNOSIS — E039 Hypothyroidism, unspecified: Secondary | ICD-10-CM | POA: Diagnosis not present

## 2017-08-26 DIAGNOSIS — L89610 Pressure ulcer of right heel, unstageable: Secondary | ICD-10-CM | POA: Diagnosis not present

## 2017-08-26 DIAGNOSIS — I129 Hypertensive chronic kidney disease with stage 1 through stage 4 chronic kidney disease, or unspecified chronic kidney disease: Secondary | ICD-10-CM | POA: Diagnosis not present

## 2017-08-26 DIAGNOSIS — D631 Anemia in chronic kidney disease: Secondary | ICD-10-CM | POA: Diagnosis not present

## 2017-08-26 DIAGNOSIS — M549 Dorsalgia, unspecified: Secondary | ICD-10-CM | POA: Diagnosis not present

## 2017-08-26 DIAGNOSIS — N183 Chronic kidney disease, stage 3 (moderate): Secondary | ICD-10-CM | POA: Diagnosis not present

## 2017-08-26 DIAGNOSIS — Z7984 Long term (current) use of oral hypoglycemic drugs: Secondary | ICD-10-CM | POA: Diagnosis not present

## 2017-08-26 DIAGNOSIS — E039 Hypothyroidism, unspecified: Secondary | ICD-10-CM | POA: Diagnosis not present

## 2017-08-26 DIAGNOSIS — E782 Mixed hyperlipidemia: Secondary | ICD-10-CM | POA: Diagnosis not present

## 2017-08-26 DIAGNOSIS — E1122 Type 2 diabetes mellitus with diabetic chronic kidney disease: Secondary | ICD-10-CM | POA: Diagnosis not present

## 2017-08-29 DIAGNOSIS — R269 Unspecified abnormalities of gait and mobility: Secondary | ICD-10-CM | POA: Diagnosis not present

## 2017-09-01 DIAGNOSIS — Z7984 Long term (current) use of oral hypoglycemic drugs: Secondary | ICD-10-CM | POA: Diagnosis not present

## 2017-09-01 DIAGNOSIS — I129 Hypertensive chronic kidney disease with stage 1 through stage 4 chronic kidney disease, or unspecified chronic kidney disease: Secondary | ICD-10-CM | POA: Diagnosis not present

## 2017-09-01 DIAGNOSIS — L89610 Pressure ulcer of right heel, unstageable: Secondary | ICD-10-CM | POA: Diagnosis not present

## 2017-09-01 DIAGNOSIS — N183 Chronic kidney disease, stage 3 (moderate): Secondary | ICD-10-CM | POA: Diagnosis not present

## 2017-09-01 DIAGNOSIS — M549 Dorsalgia, unspecified: Secondary | ICD-10-CM | POA: Diagnosis not present

## 2017-09-01 DIAGNOSIS — D631 Anemia in chronic kidney disease: Secondary | ICD-10-CM | POA: Diagnosis not present

## 2017-09-01 DIAGNOSIS — E1122 Type 2 diabetes mellitus with diabetic chronic kidney disease: Secondary | ICD-10-CM | POA: Diagnosis not present

## 2017-09-01 DIAGNOSIS — E782 Mixed hyperlipidemia: Secondary | ICD-10-CM | POA: Diagnosis not present

## 2017-09-01 DIAGNOSIS — E039 Hypothyroidism, unspecified: Secondary | ICD-10-CM | POA: Diagnosis not present

## 2017-09-21 DIAGNOSIS — E1122 Type 2 diabetes mellitus with diabetic chronic kidney disease: Secondary | ICD-10-CM | POA: Diagnosis not present

## 2017-09-21 DIAGNOSIS — L89610 Pressure ulcer of right heel, unstageable: Secondary | ICD-10-CM | POA: Diagnosis not present

## 2017-09-21 DIAGNOSIS — I129 Hypertensive chronic kidney disease with stage 1 through stage 4 chronic kidney disease, or unspecified chronic kidney disease: Secondary | ICD-10-CM | POA: Diagnosis not present

## 2017-09-21 DIAGNOSIS — N183 Chronic kidney disease, stage 3 (moderate): Secondary | ICD-10-CM | POA: Diagnosis not present

## 2017-09-26 DIAGNOSIS — R269 Unspecified abnormalities of gait and mobility: Secondary | ICD-10-CM | POA: Diagnosis not present

## 2017-10-27 DIAGNOSIS — R269 Unspecified abnormalities of gait and mobility: Secondary | ICD-10-CM | POA: Diagnosis not present

## 2017-11-26 DIAGNOSIS — R269 Unspecified abnormalities of gait and mobility: Secondary | ICD-10-CM | POA: Diagnosis not present

## 2017-12-27 DIAGNOSIS — R269 Unspecified abnormalities of gait and mobility: Secondary | ICD-10-CM | POA: Diagnosis not present

## 2018-01-20 DIAGNOSIS — E1165 Type 2 diabetes mellitus with hyperglycemia: Secondary | ICD-10-CM | POA: Diagnosis not present

## 2018-01-20 DIAGNOSIS — S90422A Blister (nonthermal), left great toe, initial encounter: Secondary | ICD-10-CM | POA: Diagnosis not present

## 2018-01-26 DIAGNOSIS — R269 Unspecified abnormalities of gait and mobility: Secondary | ICD-10-CM | POA: Diagnosis not present

## 2018-02-26 DIAGNOSIS — R269 Unspecified abnormalities of gait and mobility: Secondary | ICD-10-CM | POA: Diagnosis not present

## 2019-01-23 DIAGNOSIS — E039 Hypothyroidism, unspecified: Secondary | ICD-10-CM | POA: Diagnosis not present

## 2019-01-23 DIAGNOSIS — E1122 Type 2 diabetes mellitus with diabetic chronic kidney disease: Secondary | ICD-10-CM | POA: Diagnosis not present

## 2019-01-23 DIAGNOSIS — I10 Essential (primary) hypertension: Secondary | ICD-10-CM | POA: Diagnosis not present

## 2019-01-23 DIAGNOSIS — E782 Mixed hyperlipidemia: Secondary | ICD-10-CM | POA: Diagnosis not present

## 2019-01-23 DIAGNOSIS — N183 Chronic kidney disease, stage 3 (moderate): Secondary | ICD-10-CM | POA: Diagnosis not present

## 2019-01-23 DIAGNOSIS — D631 Anemia in chronic kidney disease: Secondary | ICD-10-CM | POA: Diagnosis not present

## 2019-01-24 DIAGNOSIS — R300 Dysuria: Secondary | ICD-10-CM | POA: Diagnosis not present

## 2019-01-25 DIAGNOSIS — N183 Chronic kidney disease, stage 3 (moderate): Secondary | ICD-10-CM | POA: Diagnosis not present

## 2019-01-25 DIAGNOSIS — E782 Mixed hyperlipidemia: Secondary | ICD-10-CM | POA: Diagnosis not present

## 2019-01-25 DIAGNOSIS — D631 Anemia in chronic kidney disease: Secondary | ICD-10-CM | POA: Diagnosis not present

## 2019-01-25 DIAGNOSIS — E1122 Type 2 diabetes mellitus with diabetic chronic kidney disease: Secondary | ICD-10-CM | POA: Diagnosis not present

## 2019-01-25 DIAGNOSIS — I10 Essential (primary) hypertension: Secondary | ICD-10-CM | POA: Diagnosis not present

## 2019-01-25 DIAGNOSIS — E039 Hypothyroidism, unspecified: Secondary | ICD-10-CM | POA: Diagnosis not present

## 2019-01-27 DIAGNOSIS — E1122 Type 2 diabetes mellitus with diabetic chronic kidney disease: Secondary | ICD-10-CM | POA: Diagnosis not present

## 2019-01-27 DIAGNOSIS — D631 Anemia in chronic kidney disease: Secondary | ICD-10-CM | POA: Diagnosis not present

## 2019-01-27 DIAGNOSIS — E782 Mixed hyperlipidemia: Secondary | ICD-10-CM | POA: Diagnosis not present

## 2019-01-27 DIAGNOSIS — I10 Essential (primary) hypertension: Secondary | ICD-10-CM | POA: Diagnosis not present

## 2019-01-27 DIAGNOSIS — N183 Chronic kidney disease, stage 3 (moderate): Secondary | ICD-10-CM | POA: Diagnosis not present

## 2019-01-27 DIAGNOSIS — E039 Hypothyroidism, unspecified: Secondary | ICD-10-CM | POA: Diagnosis not present

## 2019-01-31 DIAGNOSIS — N3090 Cystitis, unspecified without hematuria: Secondary | ICD-10-CM | POA: Diagnosis not present

## 2019-02-01 DIAGNOSIS — E782 Mixed hyperlipidemia: Secondary | ICD-10-CM | POA: Diagnosis not present

## 2019-02-01 DIAGNOSIS — N183 Chronic kidney disease, stage 3 (moderate): Secondary | ICD-10-CM | POA: Diagnosis not present

## 2019-02-01 DIAGNOSIS — E039 Hypothyroidism, unspecified: Secondary | ICD-10-CM | POA: Diagnosis not present

## 2019-02-01 DIAGNOSIS — E1122 Type 2 diabetes mellitus with diabetic chronic kidney disease: Secondary | ICD-10-CM | POA: Diagnosis not present

## 2019-02-01 DIAGNOSIS — I10 Essential (primary) hypertension: Secondary | ICD-10-CM | POA: Diagnosis not present

## 2019-02-01 DIAGNOSIS — D631 Anemia in chronic kidney disease: Secondary | ICD-10-CM | POA: Diagnosis not present

## 2019-02-03 DIAGNOSIS — E782 Mixed hyperlipidemia: Secondary | ICD-10-CM | POA: Diagnosis not present

## 2019-02-03 DIAGNOSIS — N183 Chronic kidney disease, stage 3 (moderate): Secondary | ICD-10-CM | POA: Diagnosis not present

## 2019-02-03 DIAGNOSIS — E1122 Type 2 diabetes mellitus with diabetic chronic kidney disease: Secondary | ICD-10-CM | POA: Diagnosis not present

## 2019-02-03 DIAGNOSIS — E039 Hypothyroidism, unspecified: Secondary | ICD-10-CM | POA: Diagnosis not present

## 2019-02-03 DIAGNOSIS — D631 Anemia in chronic kidney disease: Secondary | ICD-10-CM | POA: Diagnosis not present

## 2019-02-03 DIAGNOSIS — I10 Essential (primary) hypertension: Secondary | ICD-10-CM | POA: Diagnosis not present

## 2019-02-07 DIAGNOSIS — E039 Hypothyroidism, unspecified: Secondary | ICD-10-CM | POA: Diagnosis not present

## 2019-02-07 DIAGNOSIS — N183 Chronic kidney disease, stage 3 (moderate): Secondary | ICD-10-CM | POA: Diagnosis not present

## 2019-02-07 DIAGNOSIS — E782 Mixed hyperlipidemia: Secondary | ICD-10-CM | POA: Diagnosis not present

## 2019-02-07 DIAGNOSIS — E1122 Type 2 diabetes mellitus with diabetic chronic kidney disease: Secondary | ICD-10-CM | POA: Diagnosis not present

## 2019-02-07 DIAGNOSIS — I10 Essential (primary) hypertension: Secondary | ICD-10-CM | POA: Diagnosis not present

## 2019-02-07 DIAGNOSIS — D631 Anemia in chronic kidney disease: Secondary | ICD-10-CM | POA: Diagnosis not present

## 2019-02-08 DIAGNOSIS — E782 Mixed hyperlipidemia: Secondary | ICD-10-CM | POA: Diagnosis not present

## 2019-02-08 DIAGNOSIS — D631 Anemia in chronic kidney disease: Secondary | ICD-10-CM | POA: Diagnosis not present

## 2019-02-08 DIAGNOSIS — N183 Chronic kidney disease, stage 3 (moderate): Secondary | ICD-10-CM | POA: Diagnosis not present

## 2019-02-08 DIAGNOSIS — E039 Hypothyroidism, unspecified: Secondary | ICD-10-CM | POA: Diagnosis not present

## 2019-02-08 DIAGNOSIS — E1122 Type 2 diabetes mellitus with diabetic chronic kidney disease: Secondary | ICD-10-CM | POA: Diagnosis not present

## 2019-02-08 DIAGNOSIS — I10 Essential (primary) hypertension: Secondary | ICD-10-CM | POA: Diagnosis not present

## 2019-02-11 DIAGNOSIS — E1122 Type 2 diabetes mellitus with diabetic chronic kidney disease: Secondary | ICD-10-CM | POA: Diagnosis not present

## 2019-02-11 DIAGNOSIS — D631 Anemia in chronic kidney disease: Secondary | ICD-10-CM | POA: Diagnosis not present

## 2019-02-11 DIAGNOSIS — N183 Chronic kidney disease, stage 3 (moderate): Secondary | ICD-10-CM | POA: Diagnosis not present

## 2019-02-11 DIAGNOSIS — I10 Essential (primary) hypertension: Secondary | ICD-10-CM | POA: Diagnosis not present

## 2019-02-11 DIAGNOSIS — E782 Mixed hyperlipidemia: Secondary | ICD-10-CM | POA: Diagnosis not present

## 2019-02-11 DIAGNOSIS — E039 Hypothyroidism, unspecified: Secondary | ICD-10-CM | POA: Diagnosis not present

## 2019-02-14 DIAGNOSIS — E039 Hypothyroidism, unspecified: Secondary | ICD-10-CM | POA: Diagnosis not present

## 2019-02-14 DIAGNOSIS — E1122 Type 2 diabetes mellitus with diabetic chronic kidney disease: Secondary | ICD-10-CM | POA: Diagnosis not present

## 2019-02-14 DIAGNOSIS — E782 Mixed hyperlipidemia: Secondary | ICD-10-CM | POA: Diagnosis not present

## 2019-02-14 DIAGNOSIS — N183 Chronic kidney disease, stage 3 (moderate): Secondary | ICD-10-CM | POA: Diagnosis not present

## 2019-02-14 DIAGNOSIS — I10 Essential (primary) hypertension: Secondary | ICD-10-CM | POA: Diagnosis not present

## 2019-02-14 DIAGNOSIS — D631 Anemia in chronic kidney disease: Secondary | ICD-10-CM | POA: Diagnosis not present

## 2019-02-15 DIAGNOSIS — E782 Mixed hyperlipidemia: Secondary | ICD-10-CM | POA: Diagnosis not present

## 2019-02-15 DIAGNOSIS — I10 Essential (primary) hypertension: Secondary | ICD-10-CM | POA: Diagnosis not present

## 2019-02-15 DIAGNOSIS — N183 Chronic kidney disease, stage 3 (moderate): Secondary | ICD-10-CM | POA: Diagnosis not present

## 2019-02-15 DIAGNOSIS — E1122 Type 2 diabetes mellitus with diabetic chronic kidney disease: Secondary | ICD-10-CM | POA: Diagnosis not present

## 2019-02-15 DIAGNOSIS — D631 Anemia in chronic kidney disease: Secondary | ICD-10-CM | POA: Diagnosis not present

## 2019-02-15 DIAGNOSIS — E039 Hypothyroidism, unspecified: Secondary | ICD-10-CM | POA: Diagnosis not present

## 2019-02-21 DIAGNOSIS — E782 Mixed hyperlipidemia: Secondary | ICD-10-CM | POA: Diagnosis not present

## 2019-02-21 DIAGNOSIS — N183 Chronic kidney disease, stage 3 (moderate): Secondary | ICD-10-CM | POA: Diagnosis not present

## 2019-02-21 DIAGNOSIS — I10 Essential (primary) hypertension: Secondary | ICD-10-CM | POA: Diagnosis not present

## 2019-02-21 DIAGNOSIS — E039 Hypothyroidism, unspecified: Secondary | ICD-10-CM | POA: Diagnosis not present

## 2019-02-21 DIAGNOSIS — E1122 Type 2 diabetes mellitus with diabetic chronic kidney disease: Secondary | ICD-10-CM | POA: Diagnosis not present

## 2019-02-21 DIAGNOSIS — D631 Anemia in chronic kidney disease: Secondary | ICD-10-CM | POA: Diagnosis not present

## 2019-02-22 DIAGNOSIS — E039 Hypothyroidism, unspecified: Secondary | ICD-10-CM | POA: Diagnosis not present

## 2019-02-22 DIAGNOSIS — E1122 Type 2 diabetes mellitus with diabetic chronic kidney disease: Secondary | ICD-10-CM | POA: Diagnosis not present

## 2019-02-22 DIAGNOSIS — N183 Chronic kidney disease, stage 3 (moderate): Secondary | ICD-10-CM | POA: Diagnosis not present

## 2019-02-22 DIAGNOSIS — I10 Essential (primary) hypertension: Secondary | ICD-10-CM | POA: Diagnosis not present

## 2019-02-22 DIAGNOSIS — E782 Mixed hyperlipidemia: Secondary | ICD-10-CM | POA: Diagnosis not present

## 2019-02-22 DIAGNOSIS — D631 Anemia in chronic kidney disease: Secondary | ICD-10-CM | POA: Diagnosis not present

## 2019-02-28 DIAGNOSIS — E1122 Type 2 diabetes mellitus with diabetic chronic kidney disease: Secondary | ICD-10-CM | POA: Diagnosis not present

## 2019-02-28 DIAGNOSIS — N183 Chronic kidney disease, stage 3 (moderate): Secondary | ICD-10-CM | POA: Diagnosis not present

## 2019-02-28 DIAGNOSIS — E782 Mixed hyperlipidemia: Secondary | ICD-10-CM | POA: Diagnosis not present

## 2019-02-28 DIAGNOSIS — D631 Anemia in chronic kidney disease: Secondary | ICD-10-CM | POA: Diagnosis not present

## 2019-02-28 DIAGNOSIS — I10 Essential (primary) hypertension: Secondary | ICD-10-CM | POA: Diagnosis not present

## 2019-02-28 DIAGNOSIS — E039 Hypothyroidism, unspecified: Secondary | ICD-10-CM | POA: Diagnosis not present

## 2019-03-07 DIAGNOSIS — E782 Mixed hyperlipidemia: Secondary | ICD-10-CM | POA: Diagnosis not present

## 2019-03-07 DIAGNOSIS — I10 Essential (primary) hypertension: Secondary | ICD-10-CM | POA: Diagnosis not present

## 2019-03-07 DIAGNOSIS — E039 Hypothyroidism, unspecified: Secondary | ICD-10-CM | POA: Diagnosis not present

## 2019-03-07 DIAGNOSIS — N183 Chronic kidney disease, stage 3 (moderate): Secondary | ICD-10-CM | POA: Diagnosis not present

## 2019-03-07 DIAGNOSIS — E1122 Type 2 diabetes mellitus with diabetic chronic kidney disease: Secondary | ICD-10-CM | POA: Diagnosis not present

## 2019-03-07 DIAGNOSIS — D631 Anemia in chronic kidney disease: Secondary | ICD-10-CM | POA: Diagnosis not present

## 2019-03-09 DIAGNOSIS — N183 Chronic kidney disease, stage 3 (moderate): Secondary | ICD-10-CM | POA: Diagnosis not present

## 2019-03-09 DIAGNOSIS — E1122 Type 2 diabetes mellitus with diabetic chronic kidney disease: Secondary | ICD-10-CM | POA: Diagnosis not present

## 2019-03-09 DIAGNOSIS — E782 Mixed hyperlipidemia: Secondary | ICD-10-CM | POA: Diagnosis not present

## 2019-03-09 DIAGNOSIS — E039 Hypothyroidism, unspecified: Secondary | ICD-10-CM | POA: Diagnosis not present

## 2019-03-09 DIAGNOSIS — I10 Essential (primary) hypertension: Secondary | ICD-10-CM | POA: Diagnosis not present

## 2019-03-09 DIAGNOSIS — D631 Anemia in chronic kidney disease: Secondary | ICD-10-CM | POA: Diagnosis not present

## 2019-03-14 DIAGNOSIS — E039 Hypothyroidism, unspecified: Secondary | ICD-10-CM | POA: Diagnosis not present

## 2019-03-14 DIAGNOSIS — N183 Chronic kidney disease, stage 3 (moderate): Secondary | ICD-10-CM | POA: Diagnosis not present

## 2019-03-14 DIAGNOSIS — E782 Mixed hyperlipidemia: Secondary | ICD-10-CM | POA: Diagnosis not present

## 2019-03-14 DIAGNOSIS — D631 Anemia in chronic kidney disease: Secondary | ICD-10-CM | POA: Diagnosis not present

## 2019-03-14 DIAGNOSIS — E1122 Type 2 diabetes mellitus with diabetic chronic kidney disease: Secondary | ICD-10-CM | POA: Diagnosis not present

## 2019-03-14 DIAGNOSIS — I10 Essential (primary) hypertension: Secondary | ICD-10-CM | POA: Diagnosis not present

## 2019-03-15 DIAGNOSIS — E039 Hypothyroidism, unspecified: Secondary | ICD-10-CM | POA: Diagnosis not present

## 2019-03-15 DIAGNOSIS — E782 Mixed hyperlipidemia: Secondary | ICD-10-CM | POA: Diagnosis not present

## 2019-03-15 DIAGNOSIS — N183 Chronic kidney disease, stage 3 (moderate): Secondary | ICD-10-CM | POA: Diagnosis not present

## 2019-03-15 DIAGNOSIS — E1122 Type 2 diabetes mellitus with diabetic chronic kidney disease: Secondary | ICD-10-CM | POA: Diagnosis not present

## 2019-03-15 DIAGNOSIS — D631 Anemia in chronic kidney disease: Secondary | ICD-10-CM | POA: Diagnosis not present

## 2019-03-15 DIAGNOSIS — I10 Essential (primary) hypertension: Secondary | ICD-10-CM | POA: Diagnosis not present

## 2019-03-22 DIAGNOSIS — E1122 Type 2 diabetes mellitus with diabetic chronic kidney disease: Secondary | ICD-10-CM | POA: Diagnosis not present

## 2019-03-22 DIAGNOSIS — E039 Hypothyroidism, unspecified: Secondary | ICD-10-CM | POA: Diagnosis not present

## 2019-03-22 DIAGNOSIS — E782 Mixed hyperlipidemia: Secondary | ICD-10-CM | POA: Diagnosis not present

## 2019-03-22 DIAGNOSIS — N183 Chronic kidney disease, stage 3 (moderate): Secondary | ICD-10-CM | POA: Diagnosis not present

## 2019-03-22 DIAGNOSIS — D631 Anemia in chronic kidney disease: Secondary | ICD-10-CM | POA: Diagnosis not present

## 2019-03-22 DIAGNOSIS — I10 Essential (primary) hypertension: Secondary | ICD-10-CM | POA: Diagnosis not present

## 2020-01-03 ENCOUNTER — Telehealth: Payer: Self-pay | Admitting: Adult Health

## 2020-01-03 NOTE — Telephone Encounter (Signed)
Called patient, and the phone was answered by her caretaker Forrestine Him.  She notes that Smera is bedridden.  I let her know that I am calling on behalf of the covid19 vaccination initiative.  Steward Drone notes that she is waiting to hear back from the health department tomorrow and once she does she will call us back.  Lillard Anes, NP

## 2020-01-05 ENCOUNTER — Telehealth: Payer: Self-pay | Admitting: Physician Assistant

## 2020-01-05 NOTE — Telephone Encounter (Signed)
I connected by phone with [name] and/or patient's caregiver on 01/05/2020 at 5:14 PM to discuss the potential vaccination through our Homebound vaccination initiative.   Prevaccination Checklist for COVID-19 Vaccines  1.  Are you feeling sick today? no  2.  Have you ever received a dose of a COVID-19 vaccine?  no      If yes, which one? None   3.  Have you ever had an allergic reaction: (This would include a severe reaction [ e.g., anaphylaxis] that required treatment with epinephrine or EpiPen or that caused you to go to the hospital.  It would also include an allergic reaction that occurred within 4 hours that caused hives, swelling, or respiratory distress, including wheezing.) A.  A previous dose of COVID-19 vaccine. no  B.  A vaccine or injectable therapy that contains multiple components, one of which is a COVID-19 vaccine component, but it is not known which component elicited the immediate reaction. no  C.  Are you allergic to polyethylene glycol? no   4.  Have you ever had an allergic reaction to another vaccine (other than COVID-19 vaccine) or an injectable medication? (This would include a severe reaction [ e.g., anaphylaxis] that required treatment with epinephrine or EpiPen or that caused you to go to the hospital.  It would also include an allergic reaction that occurred within 4 hours that caused hives, swelling, or respiratory distress, including wheezing.)  no   5.  Have you ever had a severe allergic reaction (e.g., anaphylaxis) to something other than a component of the COVID-19 vaccine, or any vaccine or injectable medication?  This would include food, pet, venom, environmental, or oral medication allergies.  no   6.  Have you received any vaccine in the last 14 days? no   7.  Have you ever had a positive test for COVID-19 or has a doctor ever told you that you had COVID-19?  no   8.  Have you received passive antibody therapy (monoclonal antibodies or convalescent  serum) as a treatment for COVID-19? no   9.  Do you have a weakened immune system caused by something such as HIV infection or cancer or do you take immunosuppressive drugs or therapies?  no   10.  Do you have a bleeding disorder or are you taking a blood thinner? no   11.  Are you pregnant or breast-feeding? no   12.  Do you have dermal fillers? no   __________________   This patient is a 86 y.o. female that meets the FDA criteria to receive homebound vaccination. Patient or parent/caregiver understands they have the option to accept or refuse homebound vaccination.  Patient passed the pre-screening checklist and would like to proceed with homebound vaccination.  Based on questionnaire above, I recommend the patient be observed for 15 minutes.  There is one other household members/caregivers who are also interested in receiving the vaccine.    I will send the patient's information to our scheduling team who will reach out to schedule the patient and potential caregiver/family members for homebound vaccination.   She lives with her daughter Forrestine Him 09/26/1954 who also qualifies for the homebound vaccination.    Cline Crock 01/05/2020 5:14 PM

## 2020-02-19 DEATH — deceased
